# Patient Record
Sex: Female | Born: 1990 | Race: White | Hispanic: Yes | Marital: Single | Smoking: Never smoker
Health system: Southern US, Community
[De-identification: ages and names within clinical notes are randomized; demographics above are authoritative.]

## PROBLEM LIST (undated history)

## (undated) DIAGNOSIS — E282 Polycystic ovarian syndrome: Secondary | ICD-10-CM

## (undated) HISTORY — DX: Polycystic ovarian syndrome: E28.2

## (undated) HISTORY — PX: NO PAST SURGERIES: SHX2092

---

## 2009-09-08 ENCOUNTER — Emergency Department (HOSPITAL_COMMUNITY): Admission: EM | Admit: 2009-09-08 | Discharge: 2009-09-08 | Payer: Self-pay | Admitting: Emergency Medicine

## 2010-09-19 LAB — GC/CHLAMYDIA PROBE AMP, GENITAL: Chlamydia, DNA Probe: NEGATIVE

## 2010-09-19 LAB — COMPREHENSIVE METABOLIC PANEL
AST: 73 U/L — ABNORMAL HIGH (ref 0–37)
Albumin: 4.5 g/dL (ref 3.5–5.2)
BUN: 5 mg/dL — ABNORMAL LOW (ref 6–23)
Calcium: 9.1 mg/dL (ref 8.4–10.5)
Chloride: 106 mEq/L (ref 96–112)
Creatinine, Ser: 0.5 mg/dL (ref 0.4–1.2)
GFR calc Af Amer: >60 mL/min (ref >60)
Potassium: 3.3 mEq/L — ABNORMAL LOW (ref 3.5–5.1)

## 2010-09-19 LAB — WET PREP, GENITAL: Yeast Wet Prep HPF POC: NONE SEEN

## 2010-09-19 LAB — DIFFERENTIAL
Eosinophils Relative: 0 % (ref 0–5)
Lymphocytes Relative: 17 % (ref 12–46)
Monocytes Absolute: 1.2 10*3/uL — ABNORMAL HIGH (ref 0.1–1.0)
Monocytes Relative: 8 % (ref 3–12)
Neutro Abs: 12.5 10*3/uL — ABNORMAL HIGH (ref 1.7–7.7)
Neutrophils Relative %: 75 % (ref 43–77)

## 2010-09-19 LAB — URINALYSIS, ROUTINE W REFLEX MICROSCOPIC
Nitrite: NEGATIVE
Protein, ur: NEGATIVE mg/dL

## 2010-09-19 LAB — CBC
HCT: 42.8 % (ref 36.0–46.0)
MCHC: 34.2 g/dL (ref 30.0–36.0)
Platelets: 205 10*3/uL (ref 150–400)
RDW: 13.2 % (ref 11.5–15.5)

## 2010-09-19 LAB — URINE MICROSCOPIC-ADD ON

## 2010-09-19 LAB — POCT PREGNANCY, URINE: Preg Test, Ur: NEGATIVE

## 2012-02-28 ENCOUNTER — Encounter: Payer: Self-pay | Admitting: Obstetrics & Gynecology

## 2012-03-20 ENCOUNTER — Ambulatory Visit (INDEPENDENT_AMBULATORY_CARE_PROVIDER_SITE_OTHER): Payer: Self-pay | Admitting: Obstetrics & Gynecology

## 2012-03-20 ENCOUNTER — Encounter: Payer: Self-pay | Admitting: Obstetrics & Gynecology

## 2012-03-20 VITALS — BP 138/81 | HR 87 | Temp 97.6°F | Resp 12 | Ht 60.0 in | Wt 167.2 lb

## 2012-03-20 DIAGNOSIS — N949 Unspecified condition associated with female genital organs and menstrual cycle: Secondary | ICD-10-CM

## 2012-03-20 DIAGNOSIS — N938 Other specified abnormal uterine and vaginal bleeding: Secondary | ICD-10-CM

## 2012-03-20 NOTE — Progress Notes (Signed)
  Subjective:    Patient ID: Kim Blair, female    DOB: [birth year], 21 y.o.   MRN: [id number]  HPI  21 yo H G0 who [facility] for [facility] of possible PCOS. She describes monthly periods, but they are never on time. They may come 15-40 days between cycles. She also complains of unwanted hair growth. She was prescribed OCPs at the [facility]pt and told to start after her NMP, but she hasn't had one since that August visit.  Review of Systems     Objective:   Physical Exam        Assessment & Plan:  Possible PCOS- labs, u/s She will return with an interpretor for discussion of results.

## 2012-03-21 LAB — TSH: TSH: 1.256 u[IU]/mL (ref 0.350–4.500)

## 2012-03-21 LAB — TESTOSTERONE, FREE, TOTAL, SHBG: Testosterone-% Free: 1.9 % (ref 0.4–2.4)

## 2012-03-27 ENCOUNTER — Ambulatory Visit (HOSPITAL_COMMUNITY)
Admission: RE | Admit: 2012-03-27 | Discharge: 2012-03-27 | Disposition: A | Discharge status: Home / Self Care | Payer: Self-pay | Source: Ambulatory Visit | Attending: Obstetrics & Gynecology | Admitting: Obstetrics & Gynecology

## 2012-03-27 DIAGNOSIS — N938 Other specified abnormal uterine and vaginal bleeding: Secondary | ICD-10-CM | POA: Insufficient documentation

## 2012-03-27 DIAGNOSIS — N949 Unspecified condition associated with female genital organs and menstrual cycle: Secondary | ICD-10-CM | POA: Insufficient documentation

## 2012-03-27 DIAGNOSIS — Q51810 Arcuate uterus: Secondary | ICD-10-CM | POA: Insufficient documentation

## 2012-04-22 ENCOUNTER — Ambulatory Visit (INDEPENDENT_AMBULATORY_CARE_PROVIDER_SITE_OTHER): Payer: Self-pay | Admitting: Obstetrics & Gynecology

## 2012-04-22 ENCOUNTER — Encounter: Payer: Self-pay | Admitting: Obstetrics & Gynecology

## 2012-04-22 VITALS — BP 116/77 | HR 76 | Temp 97.2°F | Ht 59.0 in | Wt 164.9 lb

## 2012-04-22 DIAGNOSIS — N915 Oligomenorrhea, unspecified: Secondary | ICD-10-CM

## 2012-04-22 NOTE — Progress Notes (Signed)
  Subjective:    Patient ID: Kim Blair, female    DOB: July 08, 1990, 21 y.o.   MRN: [id number]  HPI  21 yo H G0 here for follow up of her labs. Since her last visit she started her period and has started her OCPs. She plans to get her Gardasil series at the health dept.  Review of Systems     Objective:   Physical Exam  Normal labs (test, TSH, prolactin, gyn u/s)      Assessment & Plan:   I have recommended weight loss and continue her OCPs.

## 2014-02-06 LAB — PREGNANCY, URINE: Preg Test, Ur: POSITIVE

## 2014-04-15 ENCOUNTER — Encounter (HOSPITAL_COMMUNITY): Payer: Self-pay | Admitting: *Deleted

## 2014-04-27 ENCOUNTER — Encounter (HOSPITAL_COMMUNITY): Payer: Self-pay | Admitting: *Deleted

## 2014-04-29 ENCOUNTER — Ambulatory Visit (INDEPENDENT_AMBULATORY_CARE_PROVIDER_SITE_OTHER): Payer: Self-pay | Admitting: Advanced Practice Midwife

## 2014-04-29 ENCOUNTER — Encounter: Payer: Self-pay | Admitting: Advanced Practice Midwife

## 2014-04-29 VITALS — BP 110/70 | HR 68 | Temp 98.3°F | Wt 174.3 lb

## 2014-04-29 DIAGNOSIS — Z3492 Encounter for supervision of normal pregnancy, unspecified, second trimester: Secondary | ICD-10-CM

## 2014-04-29 DIAGNOSIS — Z3482 Encounter for supervision of other normal pregnancy, second trimester: Secondary | ICD-10-CM

## 2014-04-29 DIAGNOSIS — Z23 Encounter for immunization: Secondary | ICD-10-CM

## 2014-04-29 LAB — POCT URINALYSIS DIP (DEVICE)
BILIRUBIN URINE: NEGATIVE
Glucose, UA: NEGATIVE mg/dL
Ketones, ur: NEGATIVE mg/dL
Leukocytes, UA: NEGATIVE
Nitrite: NEGATIVE
PH: 7 (ref 5.0–8.0)
Protein, ur: NEGATIVE mg/dL
Specific Gravity, Urine: 1.015 (ref 1.005–1.030)
Urobilinogen, UA: 0.2 mg/dL (ref 0.0–1.0)

## 2014-04-29 MED ORDER — CONCEPT OB 130-92.4-1 MG PO CAPS: 1.0000 | ORAL_CAPSULE | Freq: Every day | ORAL | Status: AC

## 2014-04-29 NOTE — Progress Notes (Signed)
   Subjective:    Kim Blair is a G1P0 [id number] being seen today for her first obstetrical visit.  Her obstetrical history is significant for nothing. Patient does intend to breast feed. Pregnancy history fully reviewed.  Partner has genital herpes. Pt does not.   Patient reports Occassional mild SOB at rest x 1 week and pruritic rash underarms, on lower abd, under breasts and inbtw thighs since beginning of pregnancy. No relief w/ OTC cream (name unknown).  Filed Vitals:   04/29/14 0938  BP: 110/70  Pulse: 68  Temp: 98.3 F (36.8 C)  Weight: 174 lb 4.8 oz (79.062 kg)    HISTORY: OB History  Gravida Para Term Preterm AB SAB TAB Ectopic Multiple Living  1             # Outcome Date GA Lbr Len/2nd Weight Sex Delivery Anes PTL Lv  1 Current              Past Medical History  Diagnosis Date  . PCOS (polycystic ovarian syndrome)    History reviewed. No pertinent past surgical history. History reviewed. No pertinent family history.   Exam    Uterus:  Fundal Height: 22 cm  Pelvic Exam:    Perineum: No Hemorrhoids   Vulva: normal   Vagina:  normal mucosa, normal discharge   pH: NA   Cervix: no bleeding following Pap and nulliparous appearance   Adnexa: no mass, fullness, tenderness   Bony Pelvis: average  System: Breast:  Declined exam.   Skin: dermatitis noted: dry, lacy flat rash on anterio bilt axilla and low abd. No rash viable under breast of inner thighs.    Neurologic: oriented, normal mood, gait normal; reflexes normal and symmetric, grossly non-focal   Extremities: normal strength, tone, and muscle mass, Neg Homan's. No edema   HEENT sclera clear, anicteric and thyroid without masses   Mouth/Teeth mucous membranes moist, pharynx normal without lesions and dental hygiene good   Neck no masses   Cardiovascular: regular rate and rhythm, no murmurs or gallops   Respiratory:  appears well, vitals normal, no respiratory distress, acyanotic, normal RR, chest  clear, no wheezing, crepitations, rhonchi, normal symmetric air entry   Abdomen: soft, non-tender; bowel sounds normal; no masses,  no organomegaly and Gravid, S=D   Urinary: urethral meatus normal      Assessment:    Pregnancy: G1P0 1. Supervision of normal pregnancy in second trimester  - Prenatal Profile - Hemoglobinopathy evaluation - Culture, OB Urine - Prescript Monitor Profile(19) - Cytology - PAP - US OB Comp + 14 Wk; Future - Glucose Tolerance, 1 HR (50g) w/o Fasting - Prenat w/o A Vit-[id number]-FePo-FA (CONCEPT OB) 130-92.4-1 MG CAPS; Take 1 capsule by mouth daily.  Dispense: 30 capsule; Refill: 9  2. Flu vaccine need  - Flu Vaccine QUAD 36+ mos IM; Standing - Flu Vaccine QUAD 36+ mos IM    Plan:     Initial labs drawn. Prenatal vitamins. Problem list reviewed and updated. Genetic Screening discussed Quad Screen: ordered. Ultrasound discussed; fetal survey: ordered. Follow up in 4 weeks. 50% of 30 min visit spent on counseling and coordination of care.  Early 1 hour GTT.    Dorathy KinsmanSMITH, Jarelyn Bambach 04/29/2014

## 2014-04-29 NOTE — Progress Notes (Signed)
US scheduled 11/6 @ 1530

## 2014-04-29 NOTE — Patient Instructions (Addendum)
Evaluacin de los movimientos fetales  (Fetal Movement Counts) Nombre del paciente: __________________________________________________ Kim ChapmanFecha de parto estimada: ____________________ Caroleen HammanLa evaluacin de los movimientos fetales es muy recomendable en los embarazos de alto riesgo, pero tambin es una buena idea que lo hagan todas las Beech Bottomembarazadas. El Firefightermdico le indicar que comience a contarlos a las 28 semanas de Key Centerembarazo. Los movimientos fetales suelen aumentar:   Despus de Animatoruna comida completa.  Despus de la actividad fsica.  Despus de comer o beber Kim Electricalgo dulce o fro.  En reposo. Preste atencin cuando sienta que el beb est ms activo. Esto le ayudar a notar un patrn de ciclos de vigilia y sueo de su beb y cules son los factores que contribuyen a un aumento de los movimientos fetales. Es importante llevar a cabo un recuento de movimientos fetales, al mismo tiempo cada da, cuando el beb normalmente est ms activo.  CMO CONTAR LOS MOVIMIENTOS FETALES  Busque un lugar tranquilo y cmodo para sentarse o recostarse sobre el lado izquierdo. Al recostarse sobre su lado izquierdo, le proporciona una mejor circulacin de Ettricksangre y oxgeno al beb.  Anote el da y la hora en una hoja de papel o en un diario.  Comience contando las pataditas, revoloteos, chasquidos, vueltas o pinchazos en un perodo de 2 horas. Debe sentir al menos 10 movimientos en 2 horas.  Si no siente 10 movimientos en 2 horas, espere 2  3 horas y cuente de nuevo. Busque cambios en el patrn o si no cuenta lo suficiente en 2 horas. SOLICITE ATENCIN MDICA SI:   Siente menos de 10 pataditas en 2 horas, en dos intentos.  No hay movimientos durante una hora.  El patrn se modifica o le lleva ms tiempo Art gallery managercada da contar las 10 pataditas.  Siente que el beb no se mueve como lo hace habitualmente. Fecha: ____________ Movimientos: ____________ Stevan BornHora de inicio: ____________ Stevan BornHora de finalizacin: ____________  Franco NonesFecha:  ____________ Movimientos: ____________ Stevan BornHora de inicio: ____________ Stevan BornHora de finalizacin: ____________  Franco NonesFecha: ____________ Movimientos: ____________ Stevan BornHora de inicio: ____________ Stevan BornHora de finalizacin: ____________  Franco NonesFecha: ____________ Movimientos: ____________ Stevan BornHora de inicio: ____________ Stevan BornHora de finalizacin: ____________  Franco NonesFecha: ____________ Movimientos: ____________ Stevan BornHora de inicio: ____________ Kim RussianHora de finalizacin: ____________  Franco NonesFecha: ____________ Movimientos: ____________ Kim RussianHora de inicio: ____________ Kim RussianHora de finalizacin: ____________  Franco NonesFecha: ____________ Movimientos: ____________ Kim RussianHora de inicio: ____________ Kim RussianHora de finalizacin: ____________  Franco NonesFecha: ____________ Movimientos: ____________ Kim RussianHora de inicio: ____________ Kim RussianHora de finalizacin: ____________  Franco NonesFecha: ____________ Movimientos: ____________ Kim RussianHora de inicio: ____________ Kim RussianHora de finalizacin: ____________  Franco NonesFecha: ____________ Movimientos: ____________ Kim RussianHora de inicio: ____________ Kim RussianHora de finalizacin: ____________  Franco NonesFecha: ____________ Movimientos: ____________ Kim RussianHora de inicio: ____________ Kim RussianHora de finalizacin: ____________  Franco NonesFecha: ____________ Movimientos: ____________ Kim RussianHora de inicio: ____________ Kim RussianHora de finalizacin: ____________  Franco NonesFecha: ____________ Movimientos: ____________ Kim RussianHora de inicio: ____________ Kim RussianHora de finalizacin: ____________  Franco NonesFecha: ____________ Movimientos: ____________ Kim RussianHora de inicio: ____________ Kim RussianHora de finalizacin: ____________  Franco NonesFecha: ____________ Movimientos: ____________ Kim RussianHora de inicio: ____________ Kim RussianHora de finalizacin: ____________  Franco NonesFecha: ____________ Movimientos: ____________ Kim RussianHora de inicio: ____________ Kim RussianHora de finalizacin: ____________  Franco NonesFecha: ____________ Movimientos: ____________ Kim RussianHora de inicio: ____________ Kim RussianHora de finalizacin: ____________  Franco NonesFecha: ____________ Movimientos: ____________ Kim RussianHora de inicio: ____________ Kim RussianHora de finalizacin: ____________  Franco NonesFecha: ____________ Movimientos: ____________ Kim RussianHora  de inicio: ____________ Kim RussianHora de finalizacin: ____________  Franco NonesFecha: ____________ Movimientos: ____________ Kim RussianHora de inicio: ____________ Stevan BornHora de finalizacin: ____________  Franco NonesFecha: ____________ Movimientos: ____________ Stevan BornHora de inicio: ____________ Stevan BornHora de finalizacin: ____________  Franco NonesFecha: ____________ Movimientos: ____________ Stevan BornHora de inicio: ____________ Stevan BornHora de  finalizacin: ____________  [name]: ____________ Movimientos: ____________ [name] inicio: ____________ [name] finalizacin: ____________  [name]: ____________ Movimientos: ____________ [name] inicio: ____________ [name] finalizacin: ____________  [name]: ____________ Movimientos: ____________ [name] inicio: ____________ [name] finalizacin: ____________  [name]: ____________ Movimientos: ____________ [name] inicio: ____________ [name] finalizacin: ____________  [name]: ____________ Movimientos: ____________ [name] inicio: ____________ [name] finalizacin: ____________  [name]: ____________ Movimientos: ____________ [name] inicio: ____________ Kim Blair de finalizacin: ____________  [name]: ____________ Movimientos: ____________ Kim Blair de inicio: ____________ Kim Blair de finalizacin: ____________  [name]: ____________ Movimientos: ____________ Kim Blair de inicio: ____________ Kim Blair de finalizacin: ____________  [name]: ____________ Movimientos: ____________ Kim Blair de inicio: ____________ Kim Blair de finalizacin: ____________  [name]: ____________ Movimientos: ____________ Kim Blair de inicio: ____________ Kim Blair de finalizacin: ____________  [name]: ____________ Movimientos: ____________ Kim Blair de inicio: ____________ Kim Blair de finalizacin: ____________  [name]: ____________ Movimientos: ____________ Kim Blair de inicio: ____________ Kim Blair de finalizacin: ____________  [name]: ____________ Movimientos: ____________ Kim Blair de inicio: ____________ Kim Blair de finalizacin: ____________  [name]: ____________ Movimientos: ____________ Kim Blair de inicio: ____________ Kim Blair de finalizacin:  ____________  [name]: ____________ Movimientos: ____________ Kim Blair de inicio: ____________ Kim Blair de finalizacin: ____________  [name]: ____________ Movimientos: ____________ Kim Blair de inicio: ____________ Kim Blair de finalizacin: ____________  [name]: ____________ Movimientos: ____________ Kim Blair de inicio: ____________ Kim Blair de finalizacin: ____________  [name]: ____________ Movimientos: ____________ Kim Blair de inicio: ____________ Kim Blair de finalizacin: ____________  [name]: ____________ Movimientos: ____________ Kim Blair de inicio: ____________ Kim Blair de finalizacin: ____________  [name]: ____________ Movimientos: ____________ Kim Blair de inicio: ____________ Kim Blair de finalizacin: ____________  [name]: ____________ Movimientos: ____________ Kim Blair de inicio: ____________ Kim Blair de finalizacin: ____________  [name]: ____________ Movimientos: ____________ Kim Blair de inicio: ____________ Kim Blair de finalizacin: ____________  [name]: ____________ Movimientos: ____________ Kim Blair de inicio: ____________ Kim Blair de finalizacin: ____________  [name]: ____________ Movimientos: ____________ Kim Blair de inicio: ____________ Kim Blair de finalizacin: ____________  [name]: ____________ Movimientos: ____________ Kim Blair de inicio: ____________ Kim Blair de finalizacin: ____________  [name]: ____________ Movimientos: ____________ Kim Blair de inicio: ____________ Kim Blair de finalizacin: ____________  [name]: ____________ Movimientos: ____________ Kim Blair de inicio: ____________ Kim Blair de finalizacin: ____________  [name]: ____________ Movimientos: ____________ Kim Blair de inicio: ____________ Kim Blair de finalizacin: ____________  [name]: ____________ Movimientos: ____________ Kim Blair de inicio: ____________ Kim Blair de finalizacin: ____________  [name]: ____________ Movimientos: ____________ Kim Blair de inicio: ____________ Kim Blair de finalizacin: ____________  [name]: ____________ Movimientos: ____________ Kim Blair de inicio: ____________ Kim Blair de finalizacin: ____________  [name]: ____________  Movimientos: ____________ Kim Blair de inicio: ____________ Kim Blair de finalizacin: ____________  [name]: ____________ Movimientos: ____________ Kim Blair de inicio: ____________ Kim Blair de finalizacin: ____________  [name]: ____________ Movimientos: ____________ Kim Blair de inicio: ____________ Kim Blair de finalizacin: ____________  Document Released: 09/19/2007 Document Revised: 05/29/2012 ExitCare Patient Information 2015 [place], [place]. This information is not intended to replace advice given to you by your health care provider. Make sure you discuss any questions you have with your health care provider.    Segundo trimestre de Psychiatrist (Second Trimester of Pregnancy) El segundo trimestre va desde la semana13 hasta la 28, desde el cuarto hasta el sexto mes, y suele ser el momento en el que mejor se siente. Su organismo se ha adaptado a Charity fundraiser y comienza a Diplomatic Services operational officer. En general, las nuseas matutinas han disminuido o han desaparecido completamente, p El segundo trimestre es tambin la poca en la que el feto se desarrolla rpidamente. Hacia el final del sexto mes, el feto mide aproximadamente 9pulgadas (23cm) y pesa alrededor de 1 libras (700g). Es probable que sienta que el beb se Teacher, English as a foreign language (da pataditas) [place]  18 y [place] del Psychiatrist. CAMBIOS EN EL ORGANISMO Su organismo atraviesa por muchos cambios durante el [place], y estos varan de Neomia Dear mujer a Educational psychologist.   Seguir [employer] Companies. Notar que la parte baja del abdomen sobresale.  Podrn aparecer las primeras Albertson's caderas, el abdomen y las Quinnesec.  Es posible que tenga dolores de cabeza que pueden aliviarse con los medicamentos que su mdico autorice.  Tal vez tenga necesidad de orinar con ms frecuencia porque el feto est ejerciendo presin Ambulance person.  Debido al Vanetta Mulders podr sentir Anthoney Harada estomacal con frecuencia.  Puede estar estreida, ya que ciertas hormonas enlentecen los movimientos de los  msculos que [employer] desechos a travs de los intestinos.  Pueden aparecer hemorroides o abultarse e hincharse las venas (venas varicosas).  Puede tener dolor de espalda que se debe al Citigroup de peso y a que las hormonas del Management consultant las articulaciones entre los huesos de la pelvis, y Public librarian consecuencia de la modificacin del peso y los msculos que mantienen el equilibrio.  Las [employer]ra Foods seguirn creciendo y Development worker, community.  Las Veterinary surgeon y estar sensibles al cepillado y al hilo dental.  Pueden aparecer zonas oscuras o manchas (cloasma, mscara del Psychiatrist) en el rostro que probablemente se atenuarn despus del nacimiento del beb.  Es posible que se forme una lnea oscura desde el ombligo hasta la zona del pubis (linea nigra) que probablemente se atenuarn despus del nacimiento del beb.  Tal vez haya cambios en el cabello que pueden incluir su engrosamiento, crecimiento rpido y cambios en la textura. Adems, a algunas mujeres se les cae el cabello durante o despus del embarazo, o tienen el cabello seco o fino. Lo ms probable es que el cabello se le normalice despus del nacimiento del beb. QU DEBE ESPERAR EN LAS CONSULTAS PRENATALES Durante una visita prenatal de rutina:  La pesarn para asegurarse de que usted y el feto estn creciendo normalmente.  Le tomarn la presin arterial.  Le medirn el abdomen para controlar el desarrollo del beb.  Se escucharn los latidos cardacos fetales.  Se evaluarn los resultados de los estudios solicitados en visitas anteriores. El mdico puede preguntarle lo siguiente:  Cmo se siente.  Si siente los movimientos del beb.  Si ha tenido sntomas anormales, como prdida de lquido, [place], dolores de cabeza intensos o clicos abdominales.  Si tiene [employer]. Otros estudios que podrn realizarse durante el segundo trimestre incluyen lo siguiente:  Anlisis de sangre para detectar:  Concentraciones de  hierro bajas (anemia).  Diabetes gestacional (entre la semana 24 y la 28).  Anticuerpos Rh.  Anlisis de orina para detectar infecciones, diabetes o protenas en la orina.  Una ecografa para confirmar que el beb crece y se desarrolla correctamente.  Una amniocentesis para diagnosticar posibles problemas genticos.  Estudios del feto para descartar espina bfida y sndrome de [place]. INSTRUCCIONES PARA EL CUIDADO EN EL HOGAR   Evite fumar, consumir hierbas, beber alcohol y tomar frmacos que no le hayan recetado. Estas sustancias qumicas afectan la formacin y el desarrollo del beb.  Siga las indicaciones del mdico en relacin con el uso de medicamentos. Durante el embarazo, hay medicamentos que son seguros de tomar y otros que no.  Haga actividad fsica solo en la forma indicada por el mdico. Sentir clicos uterinos es un buen signo para Restaurant manager, fast food actividad fsica.  Contine comiendo alimentos que sanos con regularidad.  Use un sostn que le brinde buen soporte si  le duelen las [place].  No se d baos de inmersin en agua caliente, baos turcos ni saunas.  Colquese el cinturn de seguridad cuando conduzca.  No coma carne cruda ni queso sin cocinar; evite el contacto con las bandejas sanitarias de los gatos y la tierra que estos animales usan. Estos elementos contienen grmenes que pueden causar defectos congnitos en el beb.  Tome las vitaminas prenatales.  Si est estreida, pruebe un laxante suave (si el mdico lo autoriza). Consuma ms alimentos ricos en fibra, como vegetales y frutas frescos y Radiation protection practitioner. Beba gran cantidad de lquido para mantener la orina de tono claro o color amarillo plido.  Dese baos de asiento con agua tibia para Engineer, materials o las molestias causadas por las hemorroides. Use una crema para las hemorroides si el mdico la autoriza.  Si tiene venas varicosas, use medias de descanso. Eleve los pies durante , 3 o 4veces por  da. Limite la cantidad de sal en su dieta.  No levante objetos pesados, use zapatos de tacones bajos y [address].  Descanse con las piernas elevadas si tiene calambres o dolor de cintura.  Visite a su dentista si an no lo ha Occupational hygienist. Use un cepillo de dientes blando para higienizarse los dientes y psese el hilo dental con suavidad.  Puede seguir [employer], a menos que el mdico le indique lo contrario.  Concurra a todas las visitas prenatales segn las indicaciones de su mdico. SOLICITE ATENCIN MDICA SI:   [place].  Siente clicos leves, presin en la pelvis o dolor persistente en el abdomen.  Tiene nuseas, vmitos o diarrea persistentes.  Tiene secrecin vaginal con mal olor.  Siente dolor al [employer]oco[employer]ill[employer]. SOLICITE ATENCIN MDICA DE INMEDIATO SI:   Tiene fiebre.  Tiene una prdida de lquido por la vagina.  Tiene sangrado o pequeas prdidas vaginales.  Siente dolor intenso o clicos en el abdomen.  Sube o baja de peso rpidamente.  Tiene dificultad para respirar y siente dolor de pecho.  Sbitamente se le hinchan mucho el rostro, las [place], los tobillos, los pies o las piernas.  No ha sentido los movimientos del beb durante Georgianne Fick.  Siente un dolor de cabeza intenso que no se alivia con medicamentos.  Hay cambios en la visin. Document Released: 03/22/2005 Document Revised: 06/17/2013 [facility] Patient Information 2015 [place], [place]. This information is not intended to replace advice given to you by your health care provider. Make sure you discuss any questions you have with your health care provider.  Lactancia materna (Breastfeeding) Decidir Museum/gallery exhibitions officer es una de las mejores elecciones que puede hacer por usted y su beb. El cambio hormonal durante el Psychiatrist produce el desarrollo del tejido mamario y [place] la cantidad y el tamao de los conductos galactforos. Estas hormonas tambin permiten que  las protenas, los azcares y las grasas de la sangre produzcan la [employer] materna en las glndulas productoras de [place]. Las hormonas impiden que la leche materna sea liberada antes del nacimiento del beb, adems de impulsar el flujo de leche luego del nacimiento. Una vez que ha comenzado a Museum/gallery exhibitions officer, Conservation officer, nature beb, as Immunologist succin o Theatre manager, pueden estimular la liberacin de [place] de las glndulas productoras de [place].  LOS BENEFICIOS DE AMAMANTAR Para el beb  La primera leche (calostro) ayuda a Careers information officer funcionamiento del sistema digestivo del beb.  La leche tiene anticuerpos que ayudan a Radio producer las infecciones en el beb.  El  beb tiene Research scientist (medical) incidencia de asma, alergias y del sndrome de muerte sbita del lactante.  Los nutrientes en la [place] materna son mejores para el beb que la Miguel Barrera maternizada y estn preparados exclusivamente para cubrir las necesidades del beb.  La leche materna mejora el desarrollo cerebral del beb.  Es menos probable que el beb desarrolle otras enfermedades, como obesidad infantil, asma o diabetes mellitus de tipo 2. Para usted   La lactancia materna favorece el desarrollo de un vnculo muy especial entre la madre y el beb.  Es conveniente. La leche materna siempre est disponible a la Human resources officer y es [place].  La lactancia materna ayuda a quemar caloras y a perder el peso ganado durante el [place].  Favorece la contraccin del tero al tamao que tena antes del embarazo de manera ms rpida y disminuye el sangrado (loquios) despus del parto.  La lactancia materna contribuye a reducir Nurse, adult de desarrollar diabetes mellitus de tipo 2, osteoporosis o cncer de mama o de ovario en el futuro. SIGNOS DE QUE EL BEB EST HAMBRIENTO Primeros signos de [address] de [place].  Se estira.  Mueve la cabeza de un lado a otro.  Mueve la cabeza y abre la boca cuando se le toca la mejilla o la  comisura de la boca (reflejo de bsqueda).  Aumenta las vocalizaciones, tales como sonidos de succin, se relame los labios, emite arrullos, suspiros, o chirridos.  Mueve la [employer] boca.  Se chupa con ganas los dedos o las manos. Signos tardos de Fisher [employer].  Llora de manera intermitente. Signos de [employer] signos de hambre extrema requerirn que lo calme y lo consuele antes de que el beb pueda alimentarse adecuadamente. No espere a que se manifiesten los siguientes signos de hambre extrema para comenzar a Museum/gallery exhibitions officer:   Designer, jewellery.  Llanto intenso y fuerte.   Gritos. INFORMACIN BSICA SOBRE LA LACTANCIA MATERNA Iniciacin de la lactancia materna  Encuentre un lugar cmodo para sentarse o acostarse, con un buen respaldo para el cuello y la espalda.  Coloque una almohada o una manta enrollada debajo del beb para acomodarlo a la altura de la mama (si est sentada). Las almohadas para Museum/gallery exhibitions officer se han diseado especialmente a fin de servir de apoyo para los brazos y el beb [employer].  Asegrese de que el abdomen del beb est frente al suyo.  Masajee suavemente la mama. Con las yemas de los dedos, masajee la pared del pecho hacia el pezn en un movimiento circular. Esto estimula el flujo de [place]. Es posible que Engineer, manufacturing systems este movimiento mientras amamanta si la leche fluye lentamente.  Sostenga la mama con el pulgar por arriba del pezn y los otros 4 dedos por debajo de la mama. Asegrese de que los dedos se encuentren lejos del pezn y de la boca del beb.  Empuje suavemente los labios del beb con el pezn o con el dedo.  Cuando la boca del beb se abra lo suficiente, acrquelo rpidamente a la mama e introduzca todo el pezn y la zona oscura que lo rodea (areola), tanto como sea posible, dentro de la boca del beb.  Debe haber ms areola visible por arriba del labio superior del beb que por debajo del labio inferior.  La lengua del  beb debe estar entre la enca inferior y la [place].  Asegrese de que la boca del beb est en la posicin correcta alrededor del pezn (prendida).  Los labios del beb deben crear un sello sobre la mama y estar doblados hacia afuera (invertidos).  Es comn que el beb succione durante 2 a 3 minutos para que comience el flujo de [place]. Cmo debe prenderse Es muy importante que le ensee al beb cmo prenderse adecuadamente a la mama. Si el beb no se prende adecuadamente, puede causarle dolor en el pezn y reducir la produccin de [place], y hacer que el beb tenga un escaso aumento de [place]. Adems, si el beb no se prende adecuadamente al pezn, puede tragar aire durante la alimentacin. Esto puede causarle molestias al beb. Hacer eructar al beb al Pilar Plate de mama puede ayudarlo a liberar el aire. Sin embargo, ensearle al beb cmo prenderse a la mama adecuadamente es la mejor manera de evitar que se sienta molesto por tragar Oceanographer se alimenta. Signos de que el beb se ha prendido adecuadamente al pezn:   Payton Doughty o succiona de modo silencioso, sin causarle dolor.  Se escucha que traga cada 3 o 4 succiones.   Hay movimientos musculares por arriba y por delante de sus odos al Printmaker. Signos de que el beb no se ha prendido Audiological scientist al pezn:   Hace ruidos de succin o de chasquido mientras se alimenta.  Siente dolor en el pezn. Si cree que el beb no se prendi correctamente, deslice el dedo en la comisura de la boca y [employer] las encas del beb para interrumpir la succin. Intente comenzar a amamantar nuevamente. Signos de Fish farm manager Signos del beb:   Disminuye gradualmente el nmero de succiones o cesa la succin por completo.  Se duerme.  Relaja el cuerpo.  Retiene una pequea cantidad de [facility] boca.  Se desprende solo del pecho. Signos que presenta usted:  Las mamas han aumentado la firmeza, el peso y el tamao 1 a  3 horas despus de Museum/gallery exhibitions officer.  Estn ms blandas inmediatamente despus de amamantar.  Un aumento del volumen de [place], y tambin un cambio en su consistencia y color se producen hacia el quinto da de Tour manager.  Los pezones no duelen, ni estn agrietados ni sangran. Signos de que su beb recibe la cantidad de leche suficiente  Moja al menos 3 paales en 24 horas. La orina debe ser clara y de color amarillo plido a los [address] de [place].  Defeca al menos 3 veces en 24 horas a los 5 [address] de [address] La materia fecal debe ser blanda y [place].  Defeca al menos 3 veces en 24 horas a los [address] de [address] La materia fecal debe ser grumosa y [place].  No registra una prdida de peso mayor del 10% del peso al nacer durante los primeros 3 [address] de [place].  Aumenta de peso un promedio de 4 a 7onzas (113 a 198g) por semana despus de los [address] de vida.  Aumenta de [place], [place], de [place] uniforme a Glass blower/designer de los [address] de vida, sin Passenger transport manager prdida de peso despus de las 2semanas de vida. Despus de alimentarse, es posible que el beb regurgite una pequea cantidad. Esto es frecuente. FRECUENCIA Y DURACIN DE LA LACTANCIA MATERNA El amamantamiento frecuente la ayudar a producir ms [place] y a Education officer, community de Engineer, mining en los pezones e hinchazn en las [place]. Alimente al beb cuando muestre signos de hambre o si siente la necesidad de reducir la congestin de las [place]. Esto se denomina "lactancia a demanda". Evite el uso del chupete [place]  trabaja para establecer la lactancia (las primeras 4 a 6 semanas despus del nacimiento del beb). Despus de este perodo, podr ofrecerle un chupete. Las investigaciones demostraron que el uso del chupete durante el primer ao de vida del beb disminuye el riesgo de desarrollar el sndrome de muerte sbita del lactante (SMSL). Permita que el nio se alimente en cada mama todo lo que desee. Contine amamantando al beb hasta que haya terminado  de alimentarse. Cuando el beb se desprende o se queda dormido mientras se est alimentando de la primera mama, ofrzcale la segunda. Debido a que, con frecuencia, los recin [place] las primeras semanas de vida, es posible que deba despertar al beb para alimentarlo. Los horarios de Acupuncturist de un beb a otro. Sin embargo, las siguientes reglas pueden servir como gua para ayudarla a Lawyer que el beb se alimenta adecuadamente:  Se puede amamantar a los recin nacidos (bebs de 4 semanas o menos de vida) cada 1 a 3 horas.  No deben transcurrir ms de 3 horas durante el da o 5 horas durante la noche sin que se amamante a los recin nacidos.  Debe amamantar al beb 8 veces como mnimo en un perodo de 24 horas, hasta que comience a introducir slidos en su dieta, a los 6 meses de vida aproximadamente. EXTRACCIN DE [employer] MATERNA La extraccin y Contractor de la leche materna le permiten asegurarse de que el beb se alimente exclusivamente de [place], aun en momentos en los que no puede amamantar. Esto tiene especial importancia si debe regresar al Aleen Campi en el perodo en que an est amamantando o si no puede estar presente en los momentos en que el beb debe alimentarse. Su asesor en lactancia puede orientarla sobre cunto tiempo es seguro almacenar [place].  El sacaleche es un aparato que le permite extraer leche de la mama a un recipiente estril. Luego, la leche materna extrada puede almacenarse en un refrigerador o Blair engineer. Algunos sacaleches son Birdie Riddle, Delaney Meigs otros son elctricos. Consulte a su asesor en lactancia qu tipo ser ms conveniente para usted. Los sacaleches se pueden comprar; sin embargo, algunos hospitales y grupos de apoyo a la lactancia materna alquilan Sports coach. Un asesor en lactancia puede ensearle cmo extraer [employer], en caso de que prefiera no usar un sacaleche.  CMO CUIDAR  LAS MAMAS DURANTE LA LACTANCIA MATERNA Los pezones se secan, agrietan y duelen durante la Tour manager. Las siguientes recomendaciones pueden ayudarla a Pharmacologist las [employer] y sanas:  Careers information officer usar jabn en los pezones.  Use un sostn de soporte. Aunque no son esenciales, las camisetas sin mangas o los sostenes especiales para Museum/gallery exhibitions officer estn diseados para acceder fcilmente a las mamas, para Museum/gallery exhibitions officer sin tener que quitarse todo el sostn o la camiseta. Evite usar sostenes con aro o sostenes muy ajustados.  Seque al aire sus pezones durante 3 a despus de amamantar al beb.  Utilice solo apsitos de Haematologist sostn para Environmental health practitioner las prdidas de [place]. La prdida de un poco de Public [employer] Group tomas es normal.  Utilice lanolina sobre los pezones luego de Museum/gallery exhibitions officer. La lanolina ayuda a mantener la humedad normal de la piel. Si [place] lanolina pura, no tiene que lavarse los pezones antes de volver a Corporate treasurer al beb. La lanolina pura no es txica para el beb. Adems, puede extraer [employer] algunas gotas de [place] materna y Engineer, maintenance (IT) suavemente esa [employer], para que la [place] se  seque al [place]. Durante las primeras semanas despus de dar a luz, algunas mujeres pueden experimentar hinchazn en las mamas (congestin [place]). La congestin puede hacer que sienta las mamas pesadas, calientes y sensibles al tacto. El pico de la congestin ocurre dentro de los 3 a 5 das despus del [place]. Las siguientes recomendaciones pueden ayudarla a Paramedic la congestin:  Vace por completo las mamas al [employer] o Environmental health practitioner. Puede aplicar calor hmedo en las mamas (en la ducha o con toallas hmedas para manos) antes de Museum/gallery exhibitions officer o extraer [employer]. Esto aumenta la circulacin y [place] a que la [place]. Si el beb no vaca por completo las [address] cuando lo [address], extraiga la [place] restante despus de que haya finalizado.  Use un sostn ajustado (para amamantar o  comn) o una camiseta sin mangas durante 1 o 2 das para indicar al cuerpo que disminuya ligeramente la produccin de [place].  Aplique compresas de hielo [employer], a menos que le resulte demasiado incmodo.  Asegrese de que el beb est prendido y se encuentre en la posicin correcta mientras lo alimenta. Si la congestin persiste luego de 48 horas o despus de seguir estas recomendaciones, comunquese con su mdico o un Holiday representative. RECOMENDACIONES GENERALES PARA EL CUIDADO DE LA SALUD DURANTE LA LACTANCIA MATERNA  Consuma alimentos saludables. Alterne comidas y colaciones, y coma 3 de cada una por da. Dado que lo que come [employer], es posible que algunas comidas hagan que su beb se vuelva ms irritable de lo habitual. Evite comer este tipo de alimentos si percibe que afectan de manera negativa al beb.  Beba leche, jugos de fruta y agua para Patent examiner su sed (aproximadamente 10 vasos al Futures trader).  Descanse con frecuencia, reljese y tome sus vitaminas prenatales para evitar la fatiga, el estrs y la anemia.  Contine con los autocontroles de la mama.  Evite masticar y fumar tabaco.  Evite el consumo de alcohol y drogas. Algunos medicamentos, que pueden ser perjudiciales para el beb, pueden pasar a travs de la [employer]. Es importante que consulte a su mdico antes de Medical sales representative, incluidos todos los medicamentos recetados y de [place], as como los suplementos vitamnicos y herbales. Puede quedar embarazada durante la lactancia. Si desea controlar la natalidad, consulte a su mdico cules son las opciones ms seguras para el beb. SOLICITE ATENCIN MDICA SI:   Usted siente que quiere dejar de Museum/gallery exhibitions officer o se siente frustrada con la lactancia.  Siente dolor en las mamas o en los pezones.  Sus pezones estn agrietados o Water quality scientist.  Sus pechos estn irritados, sensibles o calientes.  Tiene un rea hinchada en cualquiera de las  mamas.  Siente escalofros o fiebre.  Tiene nuseas o vmitos.  Presenta una secrecin de otro lquido distinto de la leche materna de los pezones.  Sus mamas no se llenan antes de Museum/gallery exhibitions officer al beb para el quinto da despus del [place].  Se siente triste y deprimida.  El beb est demasiado somnoliento como para comer bien.  El beb tiene problemas para dormir.  Moja menos de 3 paales en 24 horas.  Defeca menos de 3 veces en 24 horas.  La piel del beb o la parte blanca de los ojos se vuelven amarillentas.  El beb no ha aumentado de [place] a los [address] de [place]. SOLICITE ATENCIN MDICA DE INMEDIATO SI:   El beb est muy cansado Retail buyer) y no se quiere despertar para comer.  Conley Rolls  sube la fiebre sin causa. Document Released: 06/12/2005 Document Revised: 06/17/2013 [facility] Patient Information 2015 [place]ExitCare, MarylandLLC. This information is not intended to replace advice given to you by your health care provider. Make sure you discuss any questions you have with your health care provider. Segundo trimestre de Psychiatristembarazo (Second Trimester of Pregnancy) El segundo trimestre va desde la semana13 hasta la 28, desde el cuarto hasta el sexto mes, y suele ser el momento en el que mejor se siente. Su organismo se ha adaptado a Charity fundraiserestar embarazada y comienza a Diplomatic Services operational officersentirse fsicamente mejor. En general, las nuseas matutinas han disminuido o han desaparecido completamente, p El segundo trimestre es tambin la poca en la que el feto se desarrolla rpidamente. Hacia el final del sexto mes, el feto mide aproximadamente 9pulgadas (23cm) y pesa alrededor de 1 libras (700g). Es probable que sienta que el beb se Teacher, English as a foreign languagemueve (da pataditas) entre las 18 y 20semanas del Psychiatristembarazo. CAMBIOS EN EL ORGANISMO Su organismo atraviesa por muchos cambios durante el [place] Dunseithembarazo, y estos varan de Neomia Dearuna mujer a Educational psychologistotra.   Seguir American Standard Companiesaumentando de peso. Notar que la parte baja del abdomen sobresale.  Podrn aparecer las primeras  Al[place]on'sestras en las caderas, el abdomen y las [place] Headmamas.  Es posible que tenga dolores de cabeza que pueden aliviarse con los medicamentos que su mdico autorice.  Tal vez tenga necesidad de orinar con ms frecuencia porque el feto est ejerciendo presin Ambulance personsobre la vejiga.  Debido al Vanetta Muldersembarazo podr sentir Anthoney Haradaacidez estomacal con frecuencia.  Puede estar estreida, ya que ciertas hormonas enlentecen los movimientos de los msculos que New York Life Insuranceempujan los desechos a travs de los intestinos.  Pueden aparecer hemorroides o abultarse e hincharse las venas (venas varicosas).  Puede tener dolor de espalda que se debe al Citigroupaumento de peso y a que las hormonas del Management consultantembarazo relajan las articulaciones entre los huesos de la pelvis, y Public librariancomo consecuencia de la modificacin del peso y los msculos que mantienen el equilibrio.  Las ConAgra Foodsmamas seguirn creciendo y Development worker, communityle dolern.  Las Veterinary surgeonencas pueden sangrar y estar sensibles al cepillado y al hilo dental.  Pueden aparecer zonas oscuras o manchas (cloasma, mscara del Psychiatristembarazo) en el rostro que probablemente se atenuarn despus del nacimiento del beb.  Es posible que se forme una lnea oscura desde el ombligo hasta la zona del pubis (linea nigra) que probablemente se atenuarn despus del nacimiento del beb.  Tal vez haya cambios en el cabello que pueden incluir su engrosamiento, crecimiento rpido y cambios en la textura. Adems, a algunas mujeres se les cae el cabello durante o despus del embarazo, o tienen el cabello seco o fino. Lo ms probable es que el cabello se le normalice despus del nacimiento del beb. QU DEBE ESPERAR EN LAS CONSULTAS PRENATALES Durante una visita prenatal de rutina:  La pesarn para asegurarse de que usted y el feto estn creciendo normalmente.  Le tomarn la presin arterial.  Le medirn el abdomen para controlar el desarrollo del beb.  Se escucharn los latidos cardacos fetales.  Se evaluarn los resultados de los estudios solicitados en  visitas anteriores. El mdico puede preguntarle lo siguiente:  Cmo se siente.  Si siente los movimientos del beb.  Si ha tenido sntomas anormales, como prdida de lquido, Crockersangrado, dolores de cabeza intensos o clicos abdominales.  Si tiene Colgate-Palmolivealguna pregunta. Otros estudios que podrn realizarse durante el segundo trimestre incluyen lo siguiente:  Anlisis de sangre para detectar:  Concentraciones de hierro bajas (anemia).  Diabetes gestacional (entre la semana 24  y la 28).  Anticuerpos Rh.  Anlisis de orina para detectar infecciones, diabetes o protenas en la orina.  Una ecografa para confirmar que el beb crece y se desarrolla correctamente.  Una amniocentesis para diagnosticar posibles problemas genticos.  Estudios del feto para descartar espina bfida y sndrome de [place]. INSTRUCCIONES PARA EL CUIDADO EN EL HOGAR   Evite fumar, consumir hierbas, beber alcohol y tomar frmacos que no le hayan recetado. Estas sustancias qumicas afectan la formacin y el desarrollo del beb.  Siga las indicaciones del mdico en relacin con el uso de medicamentos. Durante el embarazo, hay medicamentos que son seguros de tomar y otros que no.  Haga actividad fsica solo en la forma indicada por el mdico. Sentir clicos uterinos es un buen signo para Restaurant manager, fast food actividad fsica.  Contine comiendo alimentos que sanos con regularidad.  Use un sostn que le brinde buen soporte si le [employer].  No se d baos de inmersin en agua caliente, baos turcos ni saunas.  Colquese el cinturn de seguridad cuando conduzca.  No coma carne cruda ni queso sin cocinar; evite el contacto con las bandejas sanitarias de los gatos y la tierra que estos animales usan. Estos elementos contienen grmenes que pueden causar defectos congnitos en el beb.  Tome las vitaminas prenatales.  Si est estreida, pruebe un laxante suave (si el mdico lo autoriza). Consuma ms alimentos ricos en fibra,  como vegetales y frutas frescos y Radiation protection practitioner. Beba gran cantidad de lquido para mantener la orina de tono claro o color amarillo plido.  Dese baos de asiento con agua tibia para Engineer, materials o las molestias causadas por las hemorroides. Use una crema para las hemorroides si el mdico la autoriza.  Si tiene venas varicosas, use medias de descanso. Eleve los pies durante , 3 o 4veces por da. Limite la cantidad de sal en su dieta.  No levante objetos pesados, use zapatos de tacones bajos y [address].  Descanse con las piernas elevadas si tiene calambres o dolor de cintura.  Visite a su dentista si an no lo ha Occupational hygienist. Use un cepillo de dientes blando para higienizarse los dientes y psese el hilo dental con suavidad.  Puede seguir [employer], a menos que el mdico le indique lo contrario.  Concurra a todas las visitas prenatales segn las indicaciones de su mdico. SOLICITE ATENCIN MDICA SI:   [place].  Siente clicos leves, presin en la pelvis o dolor persistente en el abdomen.  Tiene nuseas, vmitos o diarrea persistentes.  Tiene secrecin vaginal con mal olor.  Siente dolor al [employer]ocoPhillips. SOLICITE ATENCIN MDICA DE INMEDIATO SI:   Tiene fiebre.  Tiene una prdida de lquido por la vagina.  Tiene sangrado o pequeas prdidas vaginales.  Siente dolor intenso o clicos en el abdomen.  Sube o baja de peso rpidamente.  Tiene dificultad para respirar y siente dolor de pecho.  Sbitamente se le hinchan mucho el rostro, las [place], los tobillos, los pies o las piernas.  No ha sentido los movimientos del beb durante Georgianne Fick.  Siente un dolor de cabeza intenso que no se alivia con medicamentos.  Hay cambios en la visin. Document Released: 03/22/2005 Document Revised: 06/17/2013 [facility] Patient Information 2015 [place], [place]. This information is not intended to replace advice given to  you by your health care provider. Make sure you discuss any questions you have with your health care provider.

## 2014-04-29 NOTE — Progress Notes (Signed)
Initial lab work today and early 1hr gtt.  Alviris Almonte used as interpreter for this encounter.  Reports she has felt intermittently SOB since Saturday.  C/o of a rash that develops under breasts and on legs and arms at night.  Flu vaccine today

## 2014-04-30 LAB — PRENATAL PROFILE (SOLSTAS)
ANTIBODY SCREEN: NEGATIVE
Basophils Absolute: 0 10*3/uL (ref 0.0–0.1)
Basophils Relative: 0 % (ref 0–1)
EOS ABS: 0.2 10*3/uL (ref 0.0–0.7)
EOS PCT: 2 % (ref 0–5)
HCT: 34.7 % — ABNORMAL LOW (ref 36.0–46.0)
HEP B S AG: NEGATIVE
HIV: NONREACTIVE
Hemoglobin: 11.9 g/dL — ABNORMAL LOW (ref 12.0–15.0)
Lymphocytes Relative: 30 % (ref 12–46)
Lymphs Abs: 2.3 10*3/uL (ref 0.7–4.0)
MCH: 30.4 pg (ref 26.0–34.0)
MCHC: 34.3 g/dL (ref 30.0–36.0)
MCV: 88.5 fL (ref 78.0–100.0)
Monocytes Absolute: 0.8 10*3/uL (ref 0.1–1.0)
Monocytes Relative: 10 % (ref 3–12)
Neutro Abs: 4.4 10*3/uL (ref 1.7–7.7)
Neutrophils Relative %: 58 % (ref 43–77)
PLATELETS: 244 10*3/uL (ref 150–400)
RBC: 3.92 MIL/uL (ref 3.87–5.11)
RDW: 13.8 % (ref 11.5–15.5)
RH TYPE: POSITIVE
RUBELLA: 2.42 {index} — AB (ref <0.90)
WBC: 7.6 10*3/uL (ref 4.0–10.5)

## 2014-04-30 LAB — CYTOLOGY - PAP

## 2014-04-30 LAB — GLUCOSE TOLERANCE, 1 HOUR (50G) W/O FASTING: GLUCOSE 1 HOUR GTT: 84 mg/dL (ref 70–140)

## 2014-05-01 ENCOUNTER — Ambulatory Visit (HOSPITAL_COMMUNITY)
Admission: RE | Admit: 2014-05-01 | Discharge: 2014-05-01 | Disposition: A | Discharge status: Home / Self Care | Payer: Self-pay | Source: Ambulatory Visit | Attending: Advanced Practice Midwife | Admitting: Advanced Practice Midwife

## 2014-05-01 DIAGNOSIS — Z36 Encounter for antenatal screening of mother: Secondary | ICD-10-CM | POA: Insufficient documentation

## 2014-05-01 DIAGNOSIS — Z3492 Encounter for supervision of normal pregnancy, unspecified, second trimester: Secondary | ICD-10-CM

## 2014-05-01 DIAGNOSIS — Z3A20 20 weeks gestation of pregnancy: Secondary | ICD-10-CM | POA: Insufficient documentation

## 2014-05-01 LAB — CULTURE, OB URINE
Colony Count: NO GROWTH
Organism ID, Bacteria: NO GROWTH

## 2014-05-01 LAB — HEMOGLOBINOPATHY EVALUATION
HEMOGLOBIN OTHER: 0 %
HGB A2 QUANT: 2.9 % (ref 2.2–3.2)
HGB A: 97.1 % (ref 96.8–97.8)
HGB F QUANT: 0 % (ref 0.0–2.0)
HGB S QUANTITAION: 0 %

## 2014-05-01 LAB — PRESCRIPTION MONITORING PROFILE (19 PANEL)
Amphetamine/Meth: NEGATIVE ng/mL
Barbiturate Screen, Urine: NEGATIVE ng/mL
Benzodiazepine Screen, Urine: NEGATIVE ng/mL
Buprenorphine, Urine: NEGATIVE ng/mL
CANNABINOID SCRN UR: NEGATIVE ng/mL
CARISOPRODOL, URINE: NEGATIVE ng/mL
Cocaine Metabolites: NEGATIVE ng/mL
Creatinine, Urine: 69.06 mg/dL (ref >20.0)
FENTANYL URINE: NEGATIVE ng/mL
MDMA URINE: NEGATIVE ng/mL
Meperidine, Ur: NEGATIVE ng/mL
Methadone Screen, Urine: NEGATIVE ng/mL
Methaqualone: NEGATIVE ng/mL
Nitrites, Initial: NEGATIVE ug/mL
OXYCODONE SCRN UR: NEGATIVE ng/mL
Opiate Screen, Urine: NEGATIVE ng/mL
PHENCYCLIDINE, UR: NEGATIVE ng/mL
PROPOXYPHENE: NEGATIVE ng/mL
TAPENTADOLUR: NEGATIVE ng/mL
TRAMADOL UR: NEGATIVE ng/mL
ZOLPIDEM, URINE: NEGATIVE ng/mL
pH, Initial: 6.8 pH (ref 4.5–8.9)

## 2014-05-02 DIAGNOSIS — Z3A20 20 weeks gestation of pregnancy: Secondary | ICD-10-CM | POA: Insufficient documentation

## 2014-05-02 DIAGNOSIS — Z1389 Encounter for screening for other disorder: Secondary | ICD-10-CM | POA: Insufficient documentation

## 2014-05-27 ENCOUNTER — Ambulatory Visit (INDEPENDENT_AMBULATORY_CARE_PROVIDER_SITE_OTHER): Payer: Self-pay | Admitting: Physician Assistant

## 2014-05-27 VITALS — BP 139/87 | HR 82 | Temp 98.0°F | Wt 176.4 lb

## 2014-05-27 DIAGNOSIS — O219 Vomiting of pregnancy, unspecified: Secondary | ICD-10-CM

## 2014-05-27 DIAGNOSIS — Z3482 Encounter for supervision of other normal pregnancy, second trimester: Secondary | ICD-10-CM

## 2014-05-27 DIAGNOSIS — Z3492 Encounter for supervision of normal pregnancy, unspecified, second trimester: Secondary | ICD-10-CM

## 2014-05-27 LAB — POCT URINALYSIS DIP (DEVICE)
Bilirubin Urine: NEGATIVE
Glucose, UA: NEGATIVE mg/dL
Ketones, ur: NEGATIVE mg/dL
NITRITE: NEGATIVE
PROTEIN: NEGATIVE mg/dL
Specific Gravity, Urine: 1.02 (ref 1.005–1.030)
UROBILINOGEN UA: 0.2 mg/dL (ref 0.0–1.0)
pH: 7 (ref 5.0–8.0)

## 2014-05-27 MED ORDER — METOCLOPRAMIDE HCL 10 MG PO TABS: 10.0000 mg | ORAL_TABLET | Freq: Three times a day (TID) | ORAL | Status: DC | PRN

## 2014-05-27 NOTE — Progress Notes (Signed)
25 weeks, complaining of vomiting 2 days earlier this week, now improved.  No sick contacts.  She is feeling good fetal movement. Denies LOF, vaginal bleeding, dysuria.  Drinking well. PNV qd Reglan PRN nausea RTC 3 weeks

## 2014-05-27 NOTE — Patient Instructions (Signed)
Segundo trimestre de embarazo (Second Trimester of Pregnancy) El segundo trimestre va desde la semana13 hasta la 28, desde el cuarto hasta el sexto mes, y suele ser el momento en el que mejor se siente. Su organismo se ha adaptado a estar embarazada y comienza a sentirse fsicamente mejor. En general, las nuseas matutinas han disminuido o han desaparecido completamente, p El segundo trimestre es tambin la poca en la que el feto se desarrolla rpidamente. Hacia el final del sexto mes, el feto mide aproximadamente 9pulgadas (23cm) y pesa alrededor de 1 libras (700g). Es probable que sienta que el beb se mueve (da pataditas) entre las 18 y 20semanas del embarazo. CAMBIOS EN EL ORGANISMO Su organismo atraviesa por muchos cambios durante el embarazo, y estos varan de una mujer a otra.   Seguir aumentando de peso. Notar que la parte baja del abdomen sobresale.  Podrn aparecer las primeras estras en las caderas, el abdomen y las mamas.  Es posible que tenga dolores de cabeza que pueden aliviarse con los medicamentos que su mdico autorice.  Tal vez tenga necesidad de orinar con ms frecuencia porque el feto est ejerciendo presin sobre la vejiga.  Debido al embarazo podr sentir acidez estomacal con frecuencia.  Puede estar estreida, ya que ciertas hormonas enlentecen los movimientos de los msculos que empujan los desechos a travs de los intestinos.  Pueden aparecer hemorroides o abultarse e hincharse las venas (venas varicosas).  Puede tener dolor de espalda que se debe al aumento de peso y a que las hormonas del embarazo relajan las articulaciones entre los huesos de la pelvis, y como consecuencia de la modificacin del peso y los msculos que mantienen el equilibrio.  Las mamas seguirn creciendo y le dolern.  Las encas pueden sangrar y estar sensibles al cepillado y al hilo dental.  Pueden aparecer zonas oscuras o manchas (cloasma, mscara del embarazo) en el rostro que  probablemente se atenuarn despus del nacimiento del beb.  Es posible que se forme una lnea oscura desde el ombligo hasta la zona del pubis (linea nigra) que probablemente se atenuarn despus del nacimiento del beb.  Tal vez haya cambios en el cabello que pueden incluir su engrosamiento, crecimiento rpido y cambios en la textura. Adems, a algunas mujeres se les cae el cabello durante o despus del embarazo, o tienen el cabello seco o fino. Lo ms probable es que el cabello se le normalice despus del nacimiento del beb. QU DEBE ESPERAR EN LAS CONSULTAS PRENATALES Durante una visita prenatal de rutina:  La pesarn para asegurarse de que usted y el feto estn creciendo normalmente.  Le tomarn la presin arterial.  Le medirn el abdomen para controlar el desarrollo del beb.  Se escucharn los latidos cardacos fetales.  Se evaluarn los resultados de los estudios solicitados en visitas anteriores. El mdico puede preguntarle lo siguiente:  Cmo se siente.  Si siente los movimientos del beb.  Si ha tenido sntomas anormales, como prdida de lquido, sangrado, dolores de cabeza intensos o clicos abdominales.  Si tiene alguna pregunta. Otros estudios que podrn realizarse durante el segundo trimestre incluyen lo siguiente:  Anlisis de sangre para detectar:  Concentraciones de hierro bajas (anemia).  Diabetes gestacional (entre la semana 24 y la 28).  Anticuerpos Rh.  Anlisis de orina para detectar infecciones, diabetes o protenas en la orina.  Una ecografa para confirmar que el beb crece y se desarrolla correctamente.  Una amniocentesis para diagnosticar posibles problemas genticos.  Estudios del feto para descartar espina   bfida y sndrome de Down. INSTRUCCIONES PARA EL CUIDADO EN EL HOGAR   Evite fumar, consumir hierbas, beber alcohol y tomar frmacos que no le hayan recetado. Estas sustancias qumicas afectan la formacin y el desarrollo del beb.  Siga  las indicaciones del mdico en relacin con el uso de medicamentos. Durante el embarazo, hay medicamentos que son seguros de tomar y otros que no.  Haga actividad fsica solo en la forma indicada por el mdico. Sentir clicos uterinos es un buen signo para detener la actividad fsica.  Contine comiendo alimentos que sanos con regularidad.  Use un sostn que le brinde buen soporte si le duelen las mamas.  No se d baos de inmersin en agua caliente, baos turcos ni saunas.  Colquese el cinturn de seguridad cuando conduzca.  No coma carne cruda ni queso sin cocinar; evite el contacto con las bandejas sanitarias de los gatos y la tierra que estos animales usan. Estos elementos contienen grmenes que pueden causar defectos congnitos en el beb.  Tome las vitaminas prenatales.  Si est estreida, pruebe un laxante suave (si el mdico lo autoriza). Consuma ms alimentos ricos en fibra, como vegetales y frutas frescos y cereales integrales. Beba gran cantidad de lquido para mantener la orina de tono claro o color amarillo plido.  Dese baos de asiento con agua tibia para aliviar el dolor o las molestias causadas por las hemorroides. Use una crema para las hemorroides si el mdico la autoriza.  Si tiene venas varicosas, use medias de descanso. Eleve los pies durante 15minutos, 3 o 4veces por da. Limite la cantidad de sal en su dieta.  No levante objetos pesados, use zapatos de tacones bajos y mantenga una buena postura.  Descanse con las piernas elevadas si tiene calambres o dolor de cintura.  Visite a su dentista si an no lo ha hecho durante el embarazo. Use un cepillo de dientes blando para higienizarse los dientes y psese el hilo dental con suavidad.  Puede seguir manteniendo relaciones sexuales, a menos que el mdico le indique lo contrario.  Concurra a todas las visitas prenatales segn las indicaciones de su mdico. SOLICITE ATENCIN MDICA SI:   Tiene mareos.  Siente  clicos leves, presin en la pelvis o dolor persistente en el abdomen.  Tiene nuseas, vmitos o diarrea persistentes.  Tiene secrecin vaginal con mal olor.  Siente dolor al orinar. SOLICITE ATENCIN MDICA DE INMEDIATO SI:   Tiene fiebre.  Tiene una prdida de lquido por la vagina.  Tiene sangrado o pequeas prdidas vaginales.  Siente dolor intenso o clicos en el abdomen.  Sube o baja de peso rpidamente.  Tiene dificultad para respirar y siente dolor de pecho.  Sbitamente se le hinchan mucho el rostro, las manos, los tobillos, los pies o las piernas.  No ha sentido los movimientos del beb durante una hora.  Siente un dolor de cabeza intenso que no se alivia con medicamentos.  Hay cambios en la visin. Document Released: 03/22/2005 Document Revised: 06/17/2013 ExitCare Patient Information 2015 ExitCare, LLC. This information is not intended to replace advice given to you by your health care provider. Make sure you discuss any questions you have with your health care provider.  

## 2014-05-27 NOTE — Progress Notes (Signed)
Pressure- front of stomach Pt reports when she vomited x 2 on Monday she had little red bumps under her eyes and she has red spot in her left eye

## 2014-06-09 ENCOUNTER — Encounter: Payer: Self-pay | Admitting: *Deleted

## 2014-06-17 ENCOUNTER — Ambulatory Visit (INDEPENDENT_AMBULATORY_CARE_PROVIDER_SITE_OTHER): Payer: Self-pay | Admitting: Obstetrics and Gynecology

## 2014-06-17 ENCOUNTER — Encounter: Payer: Self-pay | Admitting: Obstetrics and Gynecology

## 2014-06-17 VITALS — BP 118/73 | HR 88 | Temp 98.4°F | Wt 177.4 lb

## 2014-06-17 DIAGNOSIS — Z23 Encounter for immunization: Secondary | ICD-10-CM

## 2014-06-17 DIAGNOSIS — Z3493 Encounter for supervision of normal pregnancy, unspecified, third trimester: Secondary | ICD-10-CM

## 2014-06-17 DIAGNOSIS — Z3492 Encounter for supervision of normal pregnancy, unspecified, second trimester: Secondary | ICD-10-CM

## 2014-06-17 LAB — POCT URINALYSIS DIP (DEVICE)
Bilirubin Urine: NEGATIVE
GLUCOSE, UA: NEGATIVE mg/dL
HGB URINE DIPSTICK: NEGATIVE
Ketones, ur: NEGATIVE mg/dL
Nitrite: NEGATIVE
Protein, ur: NEGATIVE mg/dL
SPECIFIC GRAVITY, URINE: 1.015 (ref 1.005–1.030)
Urobilinogen, UA: 0.2 mg/dL (ref 0.0–1.0)
pH: 7.5 (ref 5.0–8.0)

## 2014-06-17 LAB — CBC
HCT: 34.2 % — ABNORMAL LOW (ref 36.0–46.0)
Hemoglobin: 11.4 g/dL — ABNORMAL LOW (ref 12.0–15.0)
MCH: 30.2 pg (ref 26.0–34.0)
MCHC: 33.3 g/dL (ref 30.0–36.0)
MCV: 90.5 fL (ref 78.0–100.0)
MPV: 10.3 fL (ref 9.4–12.4)
PLATELETS: 221 10*3/uL (ref 150–400)
RBC: 3.78 MIL/uL — ABNORMAL LOW (ref 3.87–5.11)
RDW: 13.4 % (ref 11.5–15.5)
WBC: 6.6 10*3/uL (ref 4.0–10.5)

## 2014-06-17 MED ORDER — TETANUS-DIPHTH-ACELL PERTUSSIS 5-2.5-18.5 LF-MCG/0.5 IM SUSP
0.5000 mL | Freq: Once | INTRAMUSCULAR | Status: AC
  Administered 2014-06-17: 0.5 mL via INTRAMUSCULAR

## 2014-06-17 NOTE — Progress Notes (Signed)
28 wk labs with Tdap Kim Blair Spanish Interpreter for encounter

## 2014-06-17 NOTE — Patient Instructions (Signed)
Third Trimester of Pregnancy The third trimester is from week 29 through week 42, months 7 through 9. The third trimester is a time when the fetus is growing rapidly. At the end of the ninth month, the fetus is about 20 inches in length and weighs 6-10 pounds.  BODY CHANGES Your body goes through many changes during pregnancy. The changes vary from woman to woman.   Your weight will continue to increase. You can expect to gain 25-35 pounds (11-16 kg) by the end of the pregnancy.  You may begin to get stretch marks on your hips, abdomen, and breasts.  You may urinate more often because the fetus is moving lower into your pelvis and pressing on your bladder.  You may develop or continue to have heartburn as a result of your pregnancy.  You may develop constipation because certain hormones are causing the muscles that push waste through your intestines to slow down.  You may develop hemorrhoids or swollen, bulging veins (varicose veins).  You may have pelvic pain because of the weight gain and pregnancy hormones relaxing your joints between the bones in your pelvis. Backaches may result from overexertion of the muscles supporting your posture.  You may have changes in your hair. These can include thickening of your hair, rapid growth, and changes in texture. Some women also have hair loss during or after pregnancy, or hair that feels dry or thin. Your hair will most likely return to normal after your baby is born.  Your breasts will continue to grow and be tender. A yellow discharge may leak from your breasts called colostrum.  Your belly button may stick out.  You may feel short of breath because of your expanding uterus.  You may notice the fetus "dropping," or moving lower in your abdomen.  You may have a bloody mucus discharge. This usually occurs a few days to a week before labor begins.  Your cervix becomes thin and soft (effaced) near your due date. WHAT TO EXPECT AT YOUR PRENATAL  EXAMS  You will have prenatal exams every 2 weeks until week 36. Then, you will have weekly prenatal exams. During a routine prenatal visit:  You will be weighed to make sure you and the fetus are growing normally.  Your blood pressure is taken.  Your abdomen will be measured to track your baby's growth.  The fetal heartbeat will be listened to.  Any test results from the previous visit will be discussed.  You may have a cervical check near your due date to see if you have effaced. At around 36 weeks, your caregiver will check your cervix. At the same time, your caregiver will also perform a test on the secretions of the vaginal tissue. This test is to determine if a type of bacteria, Group B streptococcus, is present. Your caregiver will explain this further. Your caregiver may ask you:  What your birth plan is.  How you are feeling.  If you are feeling the baby move.  If you have had any abnormal symptoms, such as leaking fluid, bleeding, severe headaches, or abdominal cramping.  If you have any questions. Other tests or screenings that may be performed during your third trimester include:  Blood tests that check for low iron levels (anemia).  Fetal testing to check the health, activity level, and growth of the fetus. Testing is done if you have certain medical conditions or if there are problems during the pregnancy. FALSE LABOR You may feel small, irregular contractions that   eventually go away. These are called Braxton Hicks contractions, or false labor. Contractions may last for hours, days, or even weeks before true labor sets in. If contractions come at regular intervals, intensify, or become painful, it is best to be seen by your caregiver.  SIGNS OF LABOR   Menstrual-like cramps.  Contractions that are 5 minutes apart or less.  Contractions that start on the top of the uterus and spread down to the lower abdomen and back.  A sense of increased pelvic pressure or back  pain.  A watery or bloody mucus discharge that comes from the vagina. If you have any of these signs before the 37th week of pregnancy, call your caregiver right away. You need to go to the hospital to get checked immediately. HOME CARE INSTRUCTIONS   Avoid all smoking, herbs, alcohol, and unprescribed drugs. These chemicals affect the formation and growth of the baby.  Follow your caregiver's instructions regarding medicine use. There are medicines that are either safe or unsafe to take during pregnancy.  Exercise only as directed by your caregiver. Experiencing uterine cramps is a good sign to stop exercising.  Continue to eat regular, healthy meals.  Wear a good support bra for breast tenderness.  Do not use hot tubs, steam rooms, or saunas.  Wear your seat belt at all times when driving.  Avoid raw meat, uncooked cheese, cat litter boxes, and soil used by cats. These carry germs that can cause birth defects in the baby.  Take your prenatal vitamins.  Try taking a stool softener (if your caregiver approves) if you develop constipation. Eat more high-fiber foods, such as fresh vegetables or fruit and whole grains. Drink plenty of fluids to keep your urine clear or pale yellow.  Take warm sitz baths to soothe any pain or discomfort caused by hemorrhoids. Use hemorrhoid cream if your caregiver approves.  If you develop varicose veins, wear support hose. Elevate your feet for 15 minutes, 3-4 times a day. Limit salt in your diet.  Avoid heavy lifting, wear low heal shoes, and practice good posture.  Rest a lot with your legs elevated if you have leg cramps or low back pain.  Visit your dentist if you have not gone during your pregnancy. Use a soft toothbrush to brush your teeth and be gentle when you floss.  A sexual relationship may be continued unless your caregiver directs you otherwise.  Do not travel far distances unless it is absolutely necessary and only with the approval  of your caregiver.  Take prenatal classes to understand, practice, and ask questions about the labor and delivery.  Make a trial run to the hospital.  Pack your hospital bag.  Prepare the baby's nursery.  Continue to go to all your prenatal visits as directed by your caregiver. SEEK MEDICAL CARE IF:  You are unsure if you are in labor or if your water has broken.  You have dizziness.  You have mild pelvic cramps, pelvic pressure, or nagging pain in your abdominal area.  You have persistent nausea, vomiting, or diarrhea.  You have a bad smelling vaginal discharge.  You have pain with urination. SEEK IMMEDIATE MEDICAL CARE IF:   You have a fever.  You are leaking fluid from your vagina.  You have spotting or bleeding from your vagina.  You have severe abdominal cramping or pain.  You have rapid weight loss or gain.  You have shortness of breath with chest pain.  You notice sudden or extreme swelling   of your face, hands, ankles, feet, or legs.  You have not felt your baby move in over an hour.  You have severe headaches that do not go away with medicine.  You have vision changes. Document Released: 06/06/2001 Document Revised: 06/17/2013 Document Reviewed: 08/13/2012 ExitCare Patient Information 2015 [employer]. This information is not intended to replace advice given to you by your health care provider. Make sure you discuss any questions you have with your health care provider.  

## 2014-06-17 NOTE — Progress Notes (Signed)
Explained change in due date. Nausea resolved. Scheduled F/U anatomy. 28 wk labs. Discussed FM.

## 2014-06-18 LAB — GLUCOSE TOLERANCE, 1 HOUR (50G) W/O FASTING: Glucose, 1 Hour GTT: 117 mg/dL (ref 70–140)

## 2014-06-18 LAB — HIV ANTIBODY (ROUTINE TESTING W REFLEX): HIV 1&2 Ab, 4th Generation: NONREACTIVE

## 2014-06-18 LAB — RPR

## 2014-06-24 ENCOUNTER — Ambulatory Visit (HOSPITAL_COMMUNITY)
Admission: RE | Admit: 2014-06-24 | Discharge: 2014-06-24 | Disposition: A | Discharge status: Home / Self Care | Payer: Self-pay | Source: Ambulatory Visit | Attending: Obstetrics and Gynecology | Admitting: Obstetrics and Gynecology

## 2014-06-24 DIAGNOSIS — Z3A28 28 weeks gestation of pregnancy: Secondary | ICD-10-CM | POA: Insufficient documentation

## 2014-06-24 DIAGNOSIS — Z3493 Encounter for supervision of normal pregnancy, unspecified, third trimester: Secondary | ICD-10-CM

## 2014-06-24 DIAGNOSIS — IMO0002 Reserved for concepts with insufficient information to code with codable children: Secondary | ICD-10-CM | POA: Insufficient documentation

## 2014-06-24 DIAGNOSIS — Z0489 Encounter for examination and observation for other specified reasons: Secondary | ICD-10-CM | POA: Insufficient documentation

## 2014-06-24 DIAGNOSIS — Z36 Encounter for antenatal screening of mother: Secondary | ICD-10-CM | POA: Insufficient documentation

## 2014-06-26 NOTE — L&D Delivery Note (Signed)
Patient is 24 y.o. G1P0 4211w2d admitted for induction of labor for post dates, s/p cytotec, foley bulb, and pitocin. Temperature of 101 noted prior to delivery. Tylenol given.   Delivery Note At 7:47 AM a healthy female was delivered via vacuum assisted vaginal delivery, no pop offs (Presentation: cephalic; occiput anterior).  APGAR: 8, 9; weight. Nuchal cord noted.   Placenta status: spontaneous, intact.  Cord: 3 vessels with the following complications: None.  Cord pH: N/A  Anesthesia: Epidural  Episiotomy:  None Lacerations:  Partial 3rd Degree Suture Repair: 2.0 vicryl Est. Blood Loss (mL):  300  Mom to postpartum.  Baby to Couplet care / Skin to Skin. Cytotec 800mg  PO given.  [name], [place] N 09/24/2014, 8:13 AM     `````Attestation of Attending Supervision of Advanced Practitioner: Evaluation and management procedures were performed by the PA/NP/CNM/OB Fellow under my supervision/collaboration. Chart reviewed and agree with management and plan. I was the delivering physician, with counsel of patient thru translator prior to vacuum extractor use, Amniotic fluid noted to be purulent, but without identifiable malodor, so antibiotics given x 1 dose even tho delivery of placenta and fetus completed. 2 figure of 8 sutures used on the front of the anal sphincter capsule. Remainder of 2-layer closure normal closure with 2-0 Vicryl  Adalbert Alberto V 09/25/2014 7:12 AM

## 2014-07-15 ENCOUNTER — Ambulatory Visit (INDEPENDENT_AMBULATORY_CARE_PROVIDER_SITE_OTHER): Payer: Self-pay | Admitting: Advanced Practice Midwife

## 2014-07-15 VITALS — BP 113/56 | HR 72 | Temp 98.3°F | Wt 182.9 lb

## 2014-07-15 DIAGNOSIS — Z3492 Encounter for supervision of normal pregnancy, unspecified, second trimester: Secondary | ICD-10-CM

## 2014-07-15 DIAGNOSIS — Z3482 Encounter for supervision of other normal pregnancy, second trimester: Secondary | ICD-10-CM

## 2014-07-15 DIAGNOSIS — N898 Other specified noninflammatory disorders of vagina: Secondary | ICD-10-CM

## 2014-07-15 DIAGNOSIS — O26893 Other specified pregnancy related conditions, third trimester: Secondary | ICD-10-CM

## 2014-07-15 LAB — POCT URINALYSIS DIP (DEVICE)
Bilirubin Urine: NEGATIVE
Glucose, UA: NEGATIVE mg/dL
KETONES UR: NEGATIVE mg/dL
Nitrite: NEGATIVE
PH: 7 (ref 5.0–8.0)
PROTEIN: NEGATIVE mg/dL
SPECIFIC GRAVITY, URINE: 1.015 (ref 1.005–1.030)
Urobilinogen, UA: 0.2 mg/dL (ref 0.0–1.0)

## 2014-07-15 MED ORDER — TERCONAZOLE 0.4 % VA CREA: 1.0000 | TOPICAL_CREAM | Freq: Every day | VAGINAL | Status: DC

## 2014-07-15 NOTE — Patient Instructions (Signed)
Over the counter treatment for yeast infection:  Monistat 7 (miconazole nitrate) Vaginitis monilisica (Monilial Vaginitis) La vaginitis es una inflamacin (irritacin, hinchazn) de la vagina y la vulva. Esta no es una enfermedad de transmisin sexual.  CAUSAS Este tipo de vaginitis lo causa un hongo (candida) que normalmente se encuentra en la vagina. El hongo candida se ha desarrollado hasta el punto de ocasionar problemas en el equilibrio qumico. SNTOMAS  Secrecin vaginal espesa y blanca.  Hinchazn, picazn, enrojecimiento e inflamacin de la vagina y en algunos casos de los labios vaginales (vulva).  Ardor o dolor al ConocoPhillipsorinar.  Dolor en las relaciones sexuales. DIAGNSTICO Los factores que favorecen la vaginitis moniliasica son:  Everlean Pattersontapas de virginidad y postmenopusicas.  Embarazo.  Infecciones.  Sentir cansancio, estar enferma o estresada, especialmente si ya ha sufrido este problema en el pasado.  Diabetes Buen control ayudar a disminur la probabilidad.  Pldoras anticonceptivas  Ropa interior [place] ajustada.  El uso de espumas de bao, aerosoles femeninos duchas vaginales o tampones con desodorante.  Algunos antibiticos (medicamentos que destruyen grmenes).  Si contrae alguna enfermedad puede sufrir recurrencias espordicas. TRATAMIENTO El profesional que lo asiste prescribir medicamentos.  Hay diferentes tipos de cremas y supositorios vaginales que tratan especficamente la vaginitis monilisica. Para infecciones por hongos recurrentes, utilice un supositorio o crema en la vagina dos veces por semana, o segn se le indique.  Tambin podrn utilizarse cremas con corticoides o anti monilisicas para la picazn o la irritacin de la vulva. Consulte con el profesional que la asiste.  Si la crema no da resultado, podr aplicarse en la vagina una solucin con azul de metileno.  El consumo de yogur puede prevenir este tipo de vaginitis. INSTRUCCIONES PARA EL  CUIDADO DOMICILIARIO  Tome todos los medicamentos tal como se le indic.  No mantenga relaciones sexuales hasta que el tratamiento se haya completado, o segn las indicaciones del profesional que la asiste.  Tome baos de asiento tibios.  No se aplique duchas vaginales.  No utilice tampones, especialmente los perfumados.  Use ropa interior de algodn  [employer] los pantalones ajustados y las medias tipo panty.  Comunique a sus compaeros sexuales que sufre una infeccin por hongos. Ellos deben concurrir para un control mdico si tienen sntomas como una urticaria leve o picazn.  Sus compaeros sexuales deben tratarse tambin si la infeccin es difcil de Pharmacologisteliminar.  Practique el sexo seguro - use condones  Algunos medicamentos vaginales ocasionan fallas en los condones de ltex. Los medicamentos vaginales que pueden daar los condones son:  ChiropodistCrema cleocina  Butoconazole (Femstat)  Terconazole (Terazol) supositorios vaginales  Miconazole (Monistat) (es un medicamento de venta libre) SOLICITE ATENCIN MDICA SI:  Daphane ShepherdUsted tiene una temperatura oral de ms de 38,9 C (102 F).  Si la infeccin empeora luego de 2 [address]das de tratamiento.  Si la infeccin no mejora luego de 3 [address]das de tratamiento.  Aparecen ampollas en o alrededor de la vagina.  Si aparece una hemorragia vaginal y no es el momento del perodo.  Siente dolor al ConocoPhillipsorinar.  Presenta problemas intestinales.  Tiene dolor durante las The St. Paul Travelersrelaciones sexuales. Document Released: 03/22/2005 Document Revised: 09/04/2011 [facility] Hospital[facility] Patient Information 2015 [place]ExitCare, [place]. This information is not intended to replace advice given to you by your health care provider. Make sure you discuss any questions you have with your health care provider.

## 2014-07-15 NOTE — Progress Notes (Signed)
Doing well.  Good fetal movement, denies vaginal bleeding, LOF, regular contractions. Moderate leuks in urine, pt denies urinary symptoms.  Does report yellow vaginal disharge x 10 days with some itching.Marland Kitchen. SSE with white thick discharge clinging to vaginal walls.  Wet prep sent.  Terazol 7 Rx per pt preference.  Discussed OTC medication in case Rx is too expensive.

## 2014-07-16 LAB — WET PREP, GENITAL: TRICH WET PREP: NONE SEEN

## 2014-07-29 ENCOUNTER — Ambulatory Visit (INDEPENDENT_AMBULATORY_CARE_PROVIDER_SITE_OTHER): Payer: Self-pay | Admitting: Physician Assistant

## 2014-07-29 VITALS — BP 117/58 | HR 81 | Wt 184.9 lb

## 2014-07-29 DIAGNOSIS — Z3482 Encounter for supervision of other normal pregnancy, second trimester: Secondary | ICD-10-CM

## 2014-07-29 DIAGNOSIS — Z3492 Encounter for supervision of normal pregnancy, unspecified, second trimester: Secondary | ICD-10-CM

## 2014-07-29 LAB — POCT URINALYSIS DIP (DEVICE)
Bilirubin Urine: NEGATIVE
Glucose, UA: NEGATIVE mg/dL
Ketones, ur: NEGATIVE mg/dL
Nitrite: NEGATIVE
Protein, ur: NEGATIVE mg/dL
SPECIFIC GRAVITY, URINE: 1.015 (ref 1.005–1.030)
UROBILINOGEN UA: 0.2 mg/dL (ref 0.0–1.0)
pH: 7 (ref 5.0–8.0)

## 2014-07-29 NOTE — Progress Notes (Signed)
33 weeks, stable without complaint.  Denies LOF, vag bleeding, dysuria.  Endorses good fetal movement.   PNV qd RTC 2 weeks

## 2014-07-29 NOTE — Patient Instructions (Signed)
Informacin sobre el parto prematuro  (Preterm Labor Information) El parto prematuro comienza antes de la semana 37 de embarazo. La duracin de un embarazo normal es de 39 a 41 semanas.  CAUSAS  Generalmente no hay una causa que pueda identificarse del motivo por el que una mujer comienza un trabajo de parto prematuro. Sin embargo, una de las causas conocidas ms frecuentes son las infecciones. Las infecciones del tero, el cuello, la vagina, el lquido amnitico, la vejiga, los riones y hasta de los pulmones (neumona) pueden hacer que el trabajo de parto se inicie. Otras causas que pueden sospecharse son:   Infecciones urogenitales, como infecciones por hongos y vaginosis bacteriana.   Anormalidades uterinas (forma del tero, sptum uterino, fibromas, hemorragias en la placenta).   Un cuello que ha sido operado (puede ser que no permanezca cerrado).   Malformaciones del feto.   Gestaciones mltiples (mellizos, trillizos y ms).   Ruptura del saco amnitico.  FACTORES DE RIESGO   Historia previa de parto prematuro.   Tener ruptura prematura de las membranas (RPM).   La placenta cubre la abertura del cuello (placenta previa).   La placenta se separa del tero (abrupcin placentaria).   El cuello es demasiado dbil para contener al beb en el tero (cuello incompetente).   Hay mucho lquido en el saco amnitico (polihidramnios).   Consumo de drogas o hbito de fumar durante el embarazo.   No aumentar de peso lo suficiente durante el embarazo.   Mujeres menores de 18 aos o mayores de 35 aos.   Nivel socioeconmico bajo.   Pertenecer a la raza afroamericana. SNTOMAS  Los signos y sntomas del trabajo de parto prematuro son:   Clicos similares a los menstruales, dolor abdominal o dolor de espalda.  Contracciones uterinas regulares, tan frecuentes como seis por hora, sin importar su intensidad (pueden ser suaves o dolorosas).  Contracciones que comienzan  en la parte superior del tero y se expanden hacia abajo, a la zona inferior del abdomen y la espalda.   Sensacin de aumento de presin en la pelvis.   Aparece una secrecin acuosa o sanguinolenta por la vagina.  TRATAMIENTO  Segn el tiempo del embarazo y otras circunstancias, el mdico puede indicar reposo en cama. Si es necesario, le indicarn medicamentos para detener las contracciones y para madurar los pulmones del feto. Si el trabajo de parto se inicia antes de las 34 semanas de embarazo, se recomienda la hospitalizacin. El tratamiento depende de las condiciones en que se encuentren usted y el feto.  QU DEBE HACER SI PIENSA QUE EST EN TRABAJO DE PARTO PREMATURO?  Comunquese con su mdico inmediatamente. Debe concurrir al hospital para ser controlada inmediatamente.  CMO PUEDE EVITAR EL TRABAJO DE PARTO PREMATURO EN FUTUROS EMBARAZOS?  Usted debe:   Si fuma, abandonar el hbito.  Mantener un peso saludable y evitar sustancias qumicas y drogas innecesarias.  Controlar todo tipo de infeccin.  Informe a su mdico si tiene una historia conocida de trabajo de parto prematuro. Document Released: 09/19/2007 Document Revised: 02/12/2013 ExitCare Patient Information 2015 [employer]. This information is not intended to replace advice given to you by your health care provider. Make sure you discuss any questions you have with your health care provider.  

## 2014-08-12 ENCOUNTER — Ambulatory Visit (INDEPENDENT_AMBULATORY_CARE_PROVIDER_SITE_OTHER): Payer: Self-pay | Admitting: Physician Assistant

## 2014-08-12 ENCOUNTER — Encounter: Payer: Self-pay | Admitting: Physician Assistant

## 2014-08-12 VITALS — BP 122/74 | HR 77 | Wt 182.8 lb

## 2014-08-12 DIAGNOSIS — Z3403 Encounter for supervision of normal first pregnancy, third trimester: Secondary | ICD-10-CM

## 2014-08-12 LAB — POCT URINALYSIS DIP (DEVICE)
Bilirubin Urine: NEGATIVE
Glucose, UA: NEGATIVE mg/dL
Hgb urine dipstick: NEGATIVE
Ketones, ur: NEGATIVE mg/dL
Nitrite: NEGATIVE
PH: 7.5 (ref 5.0–8.0)
PROTEIN: NEGATIVE mg/dL
Specific Gravity, Urine: 1.02 (ref 1.005–1.030)
UROBILINOGEN UA: 0.2 mg/dL (ref 0.0–1.0)

## 2014-08-12 NOTE — Patient Instructions (Signed)
Tercer trimestre de embarazo (Third Trimester of Pregnancy) El tercer trimestre va desde la semana29 hasta la 42, desde el sptimo hasta el noveno mes, y es la poca en la que el feto crece ms rpidamente. Hacia el final del noveno mes, el feto mide alrededor de 20pulgadas (45cm) de largo y pesa entre 6 y 10 libras (2,700 y 4,500kg).  CAMBIOS EN EL ORGANISMO Su organismo atraviesa por muchos cambios durante el embarazo, y estos varan de una mujer a otra.   Seguir aumentando de peso. Es de esperar que aumente entre 25 y 35libras (11 y 16kg) hacia el final del embarazo.  Podrn aparecer las primeras estras en las caderas, el abdomen y las mamas.  Puede tener necesidad de orinar con ms frecuencia porque el feto baja hacia la pelvis y ejerce presin sobre la vejiga.  Debido al embarazo podr sentir acidez estomacal con frecuencia.  Puede estar estreida, ya que ciertas hormonas enlentecen los movimientos de los msculos que empujan los desechos a travs de los intestinos.  Pueden aparecer hemorroides o abultarse e hincharse las venas (venas varicosas).  Puede sentir dolor plvico debido al aumento de peso y a que las hormonas del embarazo relajan las articulaciones entre los huesos de la pelvis. El dolor de espalda puede ser consecuencia de la sobrecarga de los msculos que soportan la postura.  Tal vez haya cambios en el cabello que pueden incluir su engrosamiento, crecimiento rpido y cambios en la textura. Adems, a algunas mujeres se les cae el cabello durante o despus del embarazo, o tienen el cabello seco o fino. Lo ms probable es que el cabello se le normalice despus del nacimiento del beb.  Las mamas seguirn creciendo y le dolern. A veces, puede haber una secrecin amarilla de las mamas llamada calostro.  El ombligo puede salir hacia afuera.  Puede sentir que le falta el aire debido a que se expande el tero.  Puede notar que el feto "baja" o lo siente ms bajo, en el  abdomen.  Puede tener una prdida de secrecin mucosa con sangre. Esto suele ocurrir en el trmino de unos pocos das a una semana antes de que comience el trabajo de parto.  El cuello del tero se vuelve delgado y blando (se borra) cerca de la fecha de parto. QU DEBE ESPERAR EN LOS EXMENES PRENATALES  Le harn exmenes prenatales cada 2semanas hasta la semana36. A partir de ese momento le harn exmenes semanales. Durante una visita prenatal de rutina:  La pesarn para asegurarse de que usted y el feto estn creciendo normalmente.  Le tomarn la presin arterial.  Le medirn el abdomen para controlar el desarrollo del beb.  Se escucharn los latidos cardacos fetales.  Se evaluarn los resultados de los estudios solicitados en visitas anteriores.  Le revisarn el cuello del tero cuando est prxima la fecha de parto para controlar si este se ha borrado. Alrededor de la semana36, el mdico le revisar el cuello del tero. Al mismo tiempo, realizar un anlisis de las secreciones del tejido vaginal. Este examen es para determinar si hay un tipo de bacteria, estreptococo Grupo B. El mdico le explicar esto con ms detalle. El mdico puede preguntarle lo siguiente:  Cmo le gustara que fuera el parto.  Cmo se siente.  Si siente los movimientos del beb.  Si ha tenido sntomas anormales, como prdida de lquido, sangrado, dolores de cabeza intensos o clicos abdominales.  Si tiene alguna pregunta. Otros exmenes o estudios de deteccin que pueden realizarse   durante el tercer trimestre incluyen lo siguiente:  Anlisis de sangre para controlar las concentraciones de hierro (anemia).  Controles fetales para determinar su salud, nivel de actividad y crecimiento. Si tiene alguna enfermedad o hay problemas durante el embarazo, le harn estudios. FALSO TRABAJO DE PARTO Es posible que sienta contracciones leves e irregulares que finalmente desaparecen. Se llaman contracciones de  Braxton Hicks o falso trabajo de parto. Las contracciones pueden durar horas, das o incluso semanas, antes de que el verdadero trabajo de parto se inicie. Si las contracciones ocurren a intervalos regulares, se intensifican o se hacen dolorosas, lo mejor es que la revise el mdico.  SIGNOS DE TRABAJO DE PARTO   Clicos de tipo menstrual.  Contracciones cada 5minutos o menos.  Contracciones que comienzan en la parte superior del tero y se extienden hacia abajo, a la zona inferior del abdomen y la espalda.  Sensacin de mayor presin en la pelvis o dolor de espalda.  Una secrecin de mucosidad acuosa o con sangre que sale de la vagina. Si tiene alguno de estos signos antes de la semana37 del embarazo, llame a su mdico de inmediato. Debe concurrir al hospital para que la controlen inmediatamente. INSTRUCCIONES PARA EL CUIDADO EN EL HOGAR   Evite fumar, consumir hierbas, beber alcohol y tomar frmacos que no le hayan recetado. Estas sustancias qumicas afectan la formacin y el desarrollo del beb.  Siga las indicaciones del mdico en relacin con el uso de medicamentos. Durante el embarazo, hay medicamentos que son seguros de tomar y otros que no.  Haga actividad fsica solo en la forma indicada por el mdico. Sentir clicos uterinos es un buen signo para detener la actividad fsica.  Contine comiendo alimentos que sanos con regularidad.  Use un sostn que le brinde buen soporte si le duelen las mamas.  No se d baos de inmersin en agua caliente, baos turcos ni saunas.  Colquese el cinturn de seguridad cuando conduzca.  No coma carne cruda ni queso sin cocinar; evite el contacto con las bandejas sanitarias de los gatos y la tierra que estos animales usan. Estos elementos contienen grmenes que pueden causar defectos congnitos en el beb.  Tome las vitaminas prenatales.  Si est estreida, pruebe un laxante suave (si el mdico lo autoriza). Consuma ms alimentos ricos en  fibra, como vegetales y frutas frescos y cereales integrales. Beba gran cantidad de lquido para mantener la orina de tono claro o color amarillo plido.  Dese baos de asiento con agua tibia para aliviar el dolor o las molestias causadas por las hemorroides. Use una crema para las hemorroides si el mdico la autoriza.  Si tiene venas varicosas, use medias de descanso. Eleve los pies durante 15minutos, 3 o 4veces por da. Limite la cantidad de sal en su dieta.  Evite levantar objetos pesados, use zapatos de tacones bajos y mantenga una buena postura.  Descanse con las piernas elevadas si tiene calambres o dolor de cintura.  Visite a su dentista si no lo ha hecho durante el embarazo. Use un cepillo de dientes blando para higienizarse los dientes y psese el hilo dental con suavidad.  Puede seguir manteniendo relaciones sexuales, a menos que el mdico le indique lo contrario.  No haga viajes largos excepto que sea absolutamente necesario y solo con la autorizacin del mdico.  Tome clases prenatales para entender, practicar y hacer preguntas sobre el trabajo de parto y el parto.  Haga un ensayo de la partida al hospital.  Prepare el bolso que   llevar al hospital.  Prepare la habitacin del beb.  Concurra a todas las visitas prenatales segn las indicaciones de su mdico. SOLICITE ATENCIN MDICA SI:  No est segura de que est en trabajo de parto o de que ha roto la bolsa de las aguas.  Tiene mareos.  Siente clicos leves, presin en la pelvis o dolor persistente en el abdomen.  Tiene nuseas, vmitos o diarrea persistentes.  Tiene secrecin vaginal con mal olor.  Siente dolor al orinar. SOLICITE ATENCIN MDICA DE INMEDIATO SI:   Tiene fiebre.  Tiene una prdida de lquido por la vagina.  Tiene sangrado o pequeas prdidas vaginales.  Siente dolor intenso o clicos en el abdomen.  Sube o baja de peso rpidamente.  Tiene dificultad para respirar y siente dolor de  pecho.  Sbitamente se le hinchan mucho el rostro, las manos, los tobillos, los pies o las piernas.  No ha sentido los movimientos del beb durante una hora.  Siente un dolor de cabeza intenso que no se alivia con medicamentos.  Hay cambios en la visin. Document Released: 03/22/2005 Document Revised: 06/17/2013 [employer] Patient Information 2015 [employer]. This information is not intended to replace advice given to you by your health care provider. Make sure you discuss any questions you have with your health care provider.  

## 2014-08-12 NOTE — Progress Notes (Signed)
35 weeks, without complaint.  Endorses good fetal movement.  Denies LOF, vag bleeding, dysuria.   Labor precautions discussed RTC 1 week for GBS/GC/chlam testing

## 2014-08-14 LAB — CULTURE, OB URINE: Colony Count: 60000

## 2014-08-21 ENCOUNTER — Ambulatory Visit (INDEPENDENT_AMBULATORY_CARE_PROVIDER_SITE_OTHER): Payer: Self-pay | Admitting: Advanced Practice Midwife

## 2014-08-21 ENCOUNTER — Other Ambulatory Visit: Payer: Self-pay | Admitting: Advanced Practice Midwife

## 2014-08-21 VITALS — BP 114/74 | HR 76 | Temp 98.3°F | Wt 188.7 lb

## 2014-08-21 DIAGNOSIS — Z3492 Encounter for supervision of normal pregnancy, unspecified, second trimester: Secondary | ICD-10-CM

## 2014-08-21 DIAGNOSIS — Z3482 Encounter for supervision of other normal pregnancy, second trimester: Secondary | ICD-10-CM

## 2014-08-21 LAB — OB RESULTS CONSOLE GC/CHLAMYDIA
CHLAMYDIA, DNA PROBE: NEGATIVE
GC PROBE AMP, GENITAL: NEGATIVE

## 2014-08-21 LAB — POCT URINALYSIS DIP (DEVICE)
BILIRUBIN URINE: NEGATIVE
Glucose, UA: NEGATIVE mg/dL
HGB URINE DIPSTICK: NEGATIVE
Ketones, ur: NEGATIVE mg/dL
Nitrite: NEGATIVE
Protein, ur: NEGATIVE mg/dL
SPECIFIC GRAVITY, URINE: 1.02 (ref 1.005–1.030)
Urobilinogen, UA: 0.2 mg/dL (ref 0.0–1.0)
pH: 7 (ref 5.0–8.0)

## 2014-08-21 LAB — OB RESULTS CONSOLE GBS: STREP GROUP B AG: NEGATIVE

## 2014-08-21 NOTE — Patient Instructions (Signed)
Contracciones de Braxton Hicks (Braxton Hicks Contractions) Durante el embarazo, pueden presentarse contracciones uterinas que no siempre indican que est en trabajo de parto.  QU SON LAS CONTRACCIONES DE BRAXTON HICKS?  Las contracciones que se presentan antes del trabajo de parto se conocen como contracciones de Braxton Hicks o falso trabajo de parto. Hacia el final del embarazo (32 a 34semanas), estas contracciones pueden aparecen con ms frecuencia y volverse ms intensas. No corresponden al trabajo de parto verdadero porque estas contracciones no producen el agrandamiento (la dilatacin) y el afinamiento del cuello del tero. Algunas veces, es difcil distinguirlas del trabajo de parto verdadero porque en algunos casos pueden ser muy intensas, y las personas tienen diferentes niveles de tolerancia al dolor. No debe sentirse avergonzada si concurre al hospital con falso trabajo de parto. En ocasiones, la nica forma de saber si el trabajo de parto es verdadero es que el mdico determine si hay cambios en el cuello del tero. Si no hay problemas prenatales u otras complicaciones de salud asociadas con el embarazo, no habr inconvenientes si la envan a su casa con falso trabajo de parto y espera que comience el verdadero. CMO DIFERENCIAR EL TRABAJO DE PARTO FALSO DEL VERDADERO Falso trabajo de parto  Las contracciones del falso trabajo de parto duran menos y no son tan intensas como las verdaderas.  Generalmente son irregulares.  A menudo, se sienten en la parte delantera de la parte baja del abdomen y en la ingle,  y pueden desaparecer cuando camina o cambia de posicin mientras est acostada.  Las contracciones se vuelven ms dbiles y su duracin es menor a medida que el tiempo transcurre.  Por lo general, no se hacen progresivamente ms intensas, regulares y cercanas entre s como en el caso del trabajo de parto verdadero. Verdadero trabajo de parto 1. Las contracciones del verdadero  trabajo de parto duran de 30 a 70segundos, son muy regulares y suelen volverse ms intensas, y aumenta su frecuencia. 2. No desaparecen cuando camina. 3. La molestia generalmente se siente en la parte superior del tero y se extiende hacia la zona inferior del abdomen y hacia la cintura. 4. El mdico podr examinarla para determinar si el trabajo de parto es verdadero. El examen mostrar si el cuello del tero se est dilatando y afinando. LO QUE DEBE RECORDAR  Contine haciendo los ejercicios habituales y siga otras indicaciones que el mdico le d.  Tome todos los medicamentos como le indic el mdico.  Concurra a las visitas prenatales regulares.  Coma y beba con moderacin si cree que est en trabajo de parto.  Si las contracciones de Braxton Hicks le provocan incomodidad:  Cambie de posicin: si est acostada o descansando, camine; si est caminando, descanse.  Sintese y descanse en una baera con agua tibia.  Beba 2 o 3vasos de agua. La deshidratacin puede provocar contracciones.  Respire lenta y profundamente varias veces por hora. CUNDO DEBO BUSCAR ASISTENCIA MDICA INMEDIATA? Solicite atencin mdica de inmediato si:  Las contracciones se intensifican, se hacen ms regulares y cercanas entre s.  Tiene una prdida de lquido por la vagina.  Tiene fiebre.  Elimina mucosidad manchada con sangre.  Tiene una hemorragia vaginal abundante.  Tiene dolor abdominal permanente.  Tiene un dolor en la zona lumbar que nunca tuvo antes.  Siente que la cabeza del beb empuja hacia abajo y ejerce presin en la zona plvica.  El beb no se mueve tanto como sola. Document Released: 03/22/2005 Document Revised: 06/17/2013 [employer] Patient   Information 2015 [employer]. This information is not intended to replace advice given to you by your health care provider. Make sure you discuss any questions you have with your health care provider.  Evaluacin de los movimientos  fetales  (Fetal Movement Counts) Nombre del paciente: __________________________________________________ Fecha de parto estimada: ____________________ La evaluacin de los movimientos fetales es muy recomendable en los embarazos de alto riesgo, pero tambin es una buena idea que lo hagan todas las embarazadas. El mdico le indicar que comience a contarlos a las 28 semanas de embarazo. Los movimientos fetales suelen aumentar:   Despus de una comida completa.  Despus de la actividad fsica.  Despus de comer o beber algo dulce o fro.  En reposo. Preste atencin cuando sienta que el beb est ms activo. Esto le ayudar a notar un patrn de ciclos de vigilia y sueo de su beb y cules son los factores que contribuyen a un aumento de los movimientos fetales. Es importante llevar a cabo un recuento de movimientos fetales, al mismo tiempo cada da, cuando el beb normalmente est ms activo.  CMO CONTAR LOS MOVIMIENTOS FETALES 5. Busque un lugar tranquilo y cmodo para sentarse o recostarse sobre el lado izquierdo. Al recostarse sobre su lado izquierdo, le proporciona una mejor circulacin de sangre y oxgeno al beb. 6. Anote el da y la hora en una hoja de papel o en un diario. 7. Comience contando las pataditas, revoloteos, chasquidos, vueltas o pinchazos en un perodo de 2 horas. Debe sentir al menos 10 movimientos en 2 horas. 8. Si no siente 10 movimientos en 2 horas, espere 2  3 horas y cuente de nuevo. Busque cambios en el patrn o si no cuenta lo suficiente en 2 horas. SOLICITE ATENCIN MDICA SI:   Siente menos de 10 pataditas en 2 horas, en dos intentos.  No hay movimientos durante una hora.  El patrn se modifica o le lleva ms tiempo cada da contar las 10 pataditas.  Siente que el beb no se mueve como lo hace habitualmente. Fecha: ____________ Movimientos: ____________ Hora de inicio: ____________ Hora de finalizacin: ____________  Fecha: ____________ Movimientos:  ____________ Hora de inicio: ____________ Hora de finalizacin: ____________  Fecha: ____________ Movimientos: ____________ Hora de inicio: ____________ Hora de finalizacin: ____________  Fecha: ____________ Movimientos: ____________ Hora de inicio: ____________ Hora de finalizacin: ____________  Fecha: ____________ Movimientos: ____________ Hora de inicio: ____________ Hora de finalizacin: ____________  Fecha: ____________ Movimientos: ____________ Hora de inicio: ____________ Hora de finalizacin: ____________  Fecha: ____________ Movimientos: ____________ Hora de inicio: ____________ Hora de finalizacin: ____________  Fecha: ____________ Movimientos: ____________ Hora de inicio: ____________ Hora de finalizacin: ____________  Fecha: ____________ Movimientos: ____________ Hora de inicio: ____________ Hora de finalizacin: ____________  Fecha: ____________ Movimientos: ____________ Hora de inicio: ____________ Hora de finalizacin: ____________  Fecha: ____________ Movimientos: ____________ Hora de inicio: ____________ Hora de finalizacin: ____________  Fecha: ____________ Movimientos: ____________ Hora de inicio: ____________ Hora de finalizacin: ____________  Fecha: ____________ Movimientos: ____________ Hora de inicio: ____________ Hora de finalizacin: ____________  Fecha: ____________ Movimientos: ____________ Hora de inicio: ____________ Hora de finalizacin: ____________  Fecha: ____________ Movimientos: ____________ Hora de inicio: ____________ Hora de finalizacin: ____________  Fecha: ____________ Movimientos: ____________ Hora de inicio: ____________ Hora de finalizacin: ____________  Fecha: ____________ Movimientos: ____________ Hora de inicio: ____________ Hora de finalizacin: ____________  Fecha: ____________ Movimientos: ____________ Hora de inicio: ____________ Hora de finalizacin: ____________  Fecha: ____________ Movimientos: ____________ Hora de inicio: ____________  Hora de finalizacin: ____________  Fecha:   ____________ Movimientos: ____________ Hora de inicio: ____________ Hora de finalizacin: ____________  Fecha: ____________ Movimientos: ____________ Hora de inicio: ____________ Hora de finalizacin: ____________  Fecha: ____________ Movimientos: ____________ Hora de inicio: ____________ Hora de finalizacin: ____________  Fecha: ____________ Movimientos: ____________ Hora de inicio: ____________ Hora de finalizacin: ____________  Fecha: ____________ Movimientos: ____________ Hora de inicio: ____________ Hora de finalizacin: ____________  Fecha: ____________ Movimientos: ____________ Hora de inicio: ____________ Hora de finalizacin: ____________  Fecha: ____________ Movimientos: ____________ Hora de inicio: ____________ Hora de finalizacin: ____________  Fecha: ____________ Movimientos: ____________ Hora de inicio: ____________ Hora de finalizacin: ____________  Fecha: ____________ Movimientos: ____________ Hora de inicio: ____________ Hora de finalizacin: ____________  Fecha: ____________ Movimientos: ____________ Hora de inicio: ____________ Hora de finalizacin: ____________  Fecha: ____________ Movimientos: ____________ Hora de inicio: ____________ Hora de finalizacin: ____________  Fecha: ____________ Movimientos: ____________ Hora de inicio: ____________ Hora de finalizacin: ____________  Fecha: ____________ Movimientos: ____________ Hora de inicio: ____________ Hora de finalizacin: ____________  Fecha: ____________ Movimientos: ____________ Hora de inicio: ____________ Hora de finalizacin: ____________  Fecha: ____________ Movimientos: ____________ Hora de inicio: ____________ Hora de finalizacin: ____________  Fecha: ____________ Movimientos: ____________ Hora de inicio: ____________ Hora de finalizacin: ____________  Fecha: ____________ Movimientos: ____________ Hora de inicio: ____________ Hora de finalizacin: ____________  Fecha:  ____________ Movimientos: ____________ Hora de inicio: ____________ Hora de finalizacin: ____________  Fecha: ____________ Movimientos: ____________ Hora de inicio: ____________ Hora de finalizacin: ____________  Fecha: ____________ Movimientos: ____________ Hora de inicio: ____________ Hora de finalizacin: ____________  Fecha: ____________ Movimientos: ____________ Hora de inicio: ____________ Hora de finalizacin: ____________  Fecha: ____________ Movimientos: ____________ Hora de inicio: ____________ Hora de finalizacin: ____________  Fecha: ____________ Movimientos: ____________ Hora de inicio: ____________ Hora de finalizacin: ____________  Fecha: ____________ Movimientos: ____________ Hora de inicio: ____________ Hora de finalizacin: ____________  Fecha: ____________ Movimientos: ____________ Hora de inicio: ____________ Hora de finalizacin: ____________  Fecha: ____________ Movimientos: ____________ Hora de inicio: ____________ Hora de finalizacin: ____________  Fecha: ____________ Movimientos: ____________ Hora de inicio: ____________ Hora de finalizacin: ____________  Fecha: ____________ Movimientos: ____________ Hora de inicio: ____________ Hora de finalizacin: ____________  Fecha: ____________ Movimientos: ____________ Hora de inicio: ____________ Hora de finalizacin: ____________  Fecha: ____________ Movimientos: ____________ Hora de inicio: ____________ Hora de finalizacin: ____________  Fecha: ____________ Movimientos: ____________ Hora de inicio: ____________ Hora de finalizacin: ____________  Fecha: ____________ Movimientos: ____________ Hora de inicio: ____________ Hora de finalizacin: ____________  Fecha: ____________ Movimientos: ____________ Hora de inicio: ____________ Hora de finalizacin: ____________  Fecha: ____________ Movimientos: ____________ Hora de inicio: ____________ Hora de finalizacin: ____________  Fecha: ____________ Movimientos: ____________ Hora  de inicio: ____________ Hora de finalizacin: ____________  Fecha: ____________ Movimientos: ____________ Hora de inicio: ____________ Hora de finalizacin: ____________  Fecha: ____________ Movimientos: ____________ Hora de inicio: ____________ Hora de finalizacin: ____________  Document Released: 09/19/2007 Document Revised: 05/29/2012 [employer] Patient Information 2015 [employer]. This information is not intended to replace advice given to you by your health care provider. Make sure you discuss any questions you have with your health care provider.  

## 2014-08-21 NOTE — Progress Notes (Signed)
[employer] interpreter # [id number] Edema- feet/ face

## 2014-08-21 NOTE — Progress Notes (Signed)
GBS today. Doing well.

## 2014-08-22 LAB — GC/CHLAMYDIA PROBE AMP
CT PROBE, AMP APTIMA: NEGATIVE
GC Probe RNA: NEGATIVE

## 2014-08-24 LAB — CULTURE, BETA STREP (GROUP B ONLY)

## 2014-08-28 ENCOUNTER — Ambulatory Visit (INDEPENDENT_AMBULATORY_CARE_PROVIDER_SITE_OTHER): Payer: Self-pay | Admitting: Physician Assistant

## 2014-08-28 VITALS — BP 133/78 | HR 70 | Wt 190.1 lb

## 2014-08-28 DIAGNOSIS — R51 Headache: Secondary | ICD-10-CM

## 2014-08-28 DIAGNOSIS — O98813 Other maternal infectious and parasitic diseases complicating pregnancy, third trimester: Secondary | ICD-10-CM

## 2014-08-28 DIAGNOSIS — O219 Vomiting of pregnancy, unspecified: Secondary | ICD-10-CM

## 2014-08-28 DIAGNOSIS — Z3403 Encounter for supervision of normal first pregnancy, third trimester: Secondary | ICD-10-CM

## 2014-08-28 DIAGNOSIS — B373 Candidiasis of vulva and vagina: Secondary | ICD-10-CM

## 2014-08-28 DIAGNOSIS — B3731 Acute candidiasis of vulva and vagina: Secondary | ICD-10-CM

## 2014-08-28 DIAGNOSIS — O26893 Other specified pregnancy related conditions, third trimester: Secondary | ICD-10-CM

## 2014-08-28 LAB — POCT URINALYSIS DIP (DEVICE)
Bilirubin Urine: NEGATIVE
Glucose, UA: NEGATIVE mg/dL
KETONES UR: NEGATIVE mg/dL
Nitrite: NEGATIVE
PH: 7 (ref 5.0–8.0)
PROTEIN: NEGATIVE mg/dL
Specific Gravity, Urine: 1.01 (ref 1.005–1.030)
Urobilinogen, UA: 0.2 mg/dL (ref 0.0–1.0)

## 2014-08-28 MED ORDER — TERCONAZOLE 0.4 % VA CREA: 1.0000 | TOPICAL_CREAM | Freq: Every day | VAGINAL | Status: DC

## 2014-08-28 NOTE — Progress Notes (Signed)
37 weeks, stable.  Denies dysuria, LOF, vaginal bleeding.  Does complain of discharge.  Endorses good fetal movement.  Complaining of some intermittent nausea and headaches. Spec exam reveals large amt of yellowish clumpy adherent discharge.  No pooling of liquid.   Clinical Vaginal Yeast - rx sent for Terazol Nausea - pt has active rx for Reglan should this be needed Headache - use OTC Tylenol per package directions RTC 1 week Labor precautions discussed

## 2014-08-28 NOTE — Patient Instructions (Signed)
Vulvovaginitis Candidiásica °(Candidal Vulvovaginitis) °La vulvovaginitis candidiásica es una infección de la vagina y la vulva. La vulva es la piel que rodea la abertura de la vagina. Puede causar picazón y molestias dentro de y alrededor de la vagina.  °CUIDADOS EN EL HOGAR °· Sólo tome medicamentos como lo indique su médico. °· No mantenga relaciones sexuales hasta que la infección haya curado o según le indique el médico. °· Practique sexo seguro. °· Informe a su compañero sexual acerca de su infección. °· No tome duchas vaginales ni use tampones. °· Use ropa interior de algodón. No utilice pantalones ni pantimedias ajustados. °· Coma yogur. Esto puede ayudar a tratar y prevenir las infecciones por cándida. °SOLICITE AYUDA DE INMEDIATO SI:  °· Tiene fiebre. °· Los problemas empeoran durante el tratamiento, o si no mejora luego de 3 días. °· Tiene malestar, irritación, o picazón en la zona de la vagina o la vulva. °· Siente dolor en al mantener relaciones sexuales. °· Comienza a sentir dolor abdominal. °ASEGÚRESE DE QUE: °· Comprende estas instrucciones. °· Controlará su enfermedad. °· Solicitará ayuda de inmediato si no mejora o empeora. °Document Released: 07/15/2010 Document Revised: 06/17/2013 °Exit[employer]® Patient Information ©2015 [employer]. This information is not intended to replace advice given to you by your health care provider. Make sure you discuss any questions you have with your health care provider. ° °

## 2014-08-28 NOTE — Progress Notes (Signed)
Pt reports a thin discharge slightly watery.

## 2014-08-29 LAB — CULTURE, OB URINE: Colony Count: 25000

## 2014-09-08 ENCOUNTER — Ambulatory Visit (INDEPENDENT_AMBULATORY_CARE_PROVIDER_SITE_OTHER): Payer: Self-pay | Admitting: Family

## 2014-09-08 VITALS — BP 118/68 | HR 67 | Temp 98.3°F | Wt 193.9 lb

## 2014-09-08 DIAGNOSIS — Z3492 Encounter for supervision of normal pregnancy, unspecified, second trimester: Secondary | ICD-10-CM

## 2014-09-08 DIAGNOSIS — Z3482 Encounter for supervision of other normal pregnancy, second trimester: Secondary | ICD-10-CM

## 2014-09-08 LAB — POCT URINALYSIS DIP (DEVICE)
BILIRUBIN URINE: NEGATIVE
GLUCOSE, UA: NEGATIVE mg/dL
Hgb urine dipstick: NEGATIVE
KETONES UR: NEGATIVE mg/dL
Nitrite: NEGATIVE
Protein, ur: NEGATIVE mg/dL
SPECIFIC GRAVITY, URINE: 1.02 (ref 1.005–1.030)
Urobilinogen, UA: 0.2 mg/dL (ref 0.0–1.0)
pH: 7 (ref 5.0–8.0)

## 2014-09-08 NOTE — Progress Notes (Signed)
Pt reports having pressure with breathing, unable to get deep breath.  Happens 1-2x/week, lasts approximately 20 minutes.  Lungs CTA.   Denies chest pain.  Explained normalcy of feeling SOB towards end of pregnancy.  Report chest pain.  Reviewed signs of labor.  Also discussed that NST will be completed at next visit.  IOL at 41 wks.

## 2014-09-08 NOTE — Progress Notes (Signed)
Edema- feet   Pt reports feeling pressure on chest giving her SOB last episode was Saturday; O2 sats today 100% RA

## 2014-09-15 ENCOUNTER — Encounter: Payer: Self-pay | Admitting: Physician Assistant

## 2014-09-15 ENCOUNTER — Ambulatory Visit (INDEPENDENT_AMBULATORY_CARE_PROVIDER_SITE_OTHER): Payer: Medicaid Other | Admitting: Physician Assistant

## 2014-09-15 VITALS — BP 127/99 | HR 73 | Temp 98.2°F | Wt 195.8 lb

## 2014-09-15 DIAGNOSIS — Z3493 Encounter for supervision of normal pregnancy, unspecified, third trimester: Secondary | ICD-10-CM

## 2014-09-15 DIAGNOSIS — O480 Post-term pregnancy: Secondary | ICD-10-CM | POA: Diagnosis present

## 2014-09-15 LAB — POCT URINALYSIS DIP (DEVICE)
Bilirubin Urine: NEGATIVE
GLUCOSE, UA: NEGATIVE mg/dL
KETONES UR: NEGATIVE mg/dL
Nitrite: NEGATIVE
PROTEIN: NEGATIVE mg/dL
SPECIFIC GRAVITY, URINE: 1.015 (ref 1.005–1.030)
UROBILINOGEN UA: 0.2 mg/dL (ref 0.0–1.0)
pH: 7 (ref 5.0–8.0)

## 2014-09-15 NOTE — Progress Notes (Signed)
40 weeks today.  Endorses good fetal movement.  Denies LOF, vaginal bleeding, dysuria.   NST today. Will schedule twice weekly testing and IOL scheduled for 3/29. Labor precautions

## 2014-09-15 NOTE — Progress Notes (Signed)
[employer] [place] used for interpreter

## 2014-09-15 NOTE — Patient Instructions (Signed)
Induccin del trabajo de parto  (Labor Induction ) Se denomina induccin del trabajo de parto cuando se inician acciones para hacer que una mujer embarazada comience el trabajo de parto. La mayora de las mujeres comienzan el trabajo de parto sin ayuda entre las semanas 37 y 42 del embarazo. Cuando esto no ocurre o cuando hay una necesidad mdica, pueden utilizarse diferentes mtodos para inducirlo. La induccin del trabajo de parto hace que el tero se contraiga. Tambin hace que el cuello del tero se ablandemadure), se abra (se dilate), y se afine (se borre). Generalmente el trabajo de parto no se induce antes de las 39 semanas excepto que haya un problema con el beb o con la madre.  Antes de inducir el trabajo de parto, el mdico considerar cierto nmero de factores incluyendo los siguientes:  El estado del beb.  Cuntas semanas tiene de embarazo.  La madurez de los pulmones del beb.  El estado del cuello del tero.  La posicin del beb. CULES SON LOS MOTIVOS PARA INDUCIR UN PARTO? El trabajo de parto puede inducirse por las siguientes razones:  La salud del beb o de la madre estn en riesgo.  El embarazo se ha pasado de trmino en 1 semana o ms.  Ha roto la bolsa de aguas pero no se ha iniciado el trabajo de parto por s mismo.  La madre tiene algn trastorno de salud o una enfermedad grave, como hipertensin arterial, una infeccin, desprendimiento abrupto de la placenta o diabetes.  Hay escaso lquido amnitico alrededor del beb.  El beb presenta sufrimiento. La conveniencia o el deseo de que el beb nazca en una cierta fecha no es un motivo para inducir el parto. CULES SON LOS MTODOS UTILIZADOS PARA INDUCIR EL TRABAJO DE PARTO? Algunos mtodos de induccin del trabajo de parto son:   Administracin del medicamentos prostaglandina. Este medicamento hace que el cuello uterino se dilate y madure. Este medicamento tambin iniciar las contracciones. Puede tomarse por  boca o insertarse en la vagina en forma de supositorio.  Insercin en la vagina de un tubo delgado (catter) con un baln en el extremo para dilatar el cuello del tero. Una vez insertado, el baln se infla con agua, lo que provoca la apertura del cuello del tero.  Ruptura de las membranas. El mdico separa el saco amnitico del cuello uterino, haciendo que el cuello uterino se distienda y cause la liberacin de la hormona llamada progesterona. Esto hace que el tero se contraiga. Este procedimiento se realiza durante una visita al consultorio mdico. Le indicarn que vuelva a su casa y espere que se inicien las contracciones. Luego tendr que volver para la induccin.  Ruptura de la bolsa de aguas. El mdico romper el saco amnitico con un pequeo instrumento. Una vez que el saco amnitico se rompe, las contracciones deben comenzar. Pueden pasar algunas horas hasta que haga efecto.  Medicamentos que desencadenen o intensifiquen las contracciones. Se lo administrarn a travs de un catter por va intravenosa (IV) que se inserta en una de las venas del brazo. Todos los mtodos de induccin, excepto la ruptura de membranas, se realizan en el hospital. La induccin se realizar en el hospital, de modo que usted y el beb puedan ser controlados cuidadosamente.  CUNTO TIEMPO LLEVA INDUCIR EL TRABAJO DE PARTO? Algunas inducciones pueden demorar entre 2 y 3 das. Generalmente lleva menos tiempo, dependiendo del estado del cuello del tero. Puede tomar ms tiempo si la induccin se realiza en etapas tempranas del embarazo   o es su primer embarazo. Si han pasado 2 o 3 das y no se inicia el trabajo de parto, podrn enviarla a su casa o realizar una cesrea. CULES SON LOS RIESGOS ASOCIADOS CON LA INDUCCiN DEL TRABAJO DE PARTO? Algunos de los riesgos de la induccin son:   Cambios en la frecuencia cardaca fetal, por ejemplo los latidos son demasiado rpidos, o lentos, o errticos.  Riesgo de distrs  fetal.  Posibilidad de infeccin en la madre o el beb.  Aumento de la posibilidad de que sea necesaria una cesrea.  Ruptura (abrupcin) de la placenta del tero (raro).  Ruptura uterina (muy raro). Cuando es necesario realizar la induccin por razones mdicas, los beneficios deben superar a los riesgos. CULES SON ALGUNAS RAZONES PARA NO INDUCIR EL TRABAJO DE PARTO? La induccin no debe realizarse si:   Se demuestra que el beb no tolera el trabajo de parto.  Fue sometida anteriormente a cirugas en el tero, como una miomectoma o le han extirpado fibromas.  La placenta est en una posicin muy baja en el tero y obstruye la abertura del cuello (placenta previa).  El beb no est ubicado con la cabeza hacia bajo.  El cordn umbilical cae hacia el canal de parto, adelante del beb. Esto puede cortar el suministro de sangre y oxgeno al beb.  Fue sometida a una cesrea anteriormente.  Hay circunstancias poco habituales, como que el beb es extremadamente prematuro. Document Released: 09/19/2007 Document Revised: 02/12/2013 ExitCare Patient Information 2015 [employer]. This information is not intended to replace advice given to you by your health care provider. Make sure you discuss any questions you have with your health care provider.  

## 2014-09-15 NOTE — Progress Notes (Signed)
IOL scheduled 3/29 @ 1930.

## 2014-09-16 ENCOUNTER — Encounter (HOSPITAL_COMMUNITY): Payer: Self-pay | Admitting: *Deleted

## 2014-09-16 ENCOUNTER — Telehealth (HOSPITAL_COMMUNITY): Payer: Self-pay | Admitting: *Deleted

## 2014-09-16 LAB — CULTURE, OB URINE
Colony Count: NO GROWTH
ORGANISM ID, BACTERIA: NO GROWTH

## 2014-09-16 NOTE — Telephone Encounter (Signed)
Interpreter [phone]

## 2014-09-16 NOTE — Telephone Encounter (Signed)
Preadmission screen  

## 2014-09-17 ENCOUNTER — Ambulatory Visit (INDEPENDENT_AMBULATORY_CARE_PROVIDER_SITE_OTHER): Payer: Medicaid Other | Admitting: *Deleted

## 2014-09-17 VITALS — BP 111/64 | HR 72

## 2014-09-17 DIAGNOSIS — O480 Post-term pregnancy: Secondary | ICD-10-CM | POA: Diagnosis not present

## 2014-09-17 LAB — US OB FOLLOW UP

## 2014-09-17 NOTE — Progress Notes (Signed)
Interpreter - Amanda PeaAlvi Almonte present for encounter.  Pt reports decreased FM x 8 days - felt good FM during NST. Labor sx reviewed. IOL scheduled 3/29 @ 1930

## 2014-09-18 NOTE — Progress Notes (Signed)
NST reviewed and reactive.  

## 2014-09-21 ENCOUNTER — Ambulatory Visit (INDEPENDENT_AMBULATORY_CARE_PROVIDER_SITE_OTHER): Payer: Medicaid Other | Admitting: *Deleted

## 2014-09-21 VITALS — BP 122/71 | HR 59

## 2014-09-21 DIAGNOSIS — O480 Post-term pregnancy: Secondary | ICD-10-CM | POA: Diagnosis not present

## 2014-09-21 NOTE — Progress Notes (Signed)
Interpreter - Kim Blair present for encounter today

## 2014-09-22 ENCOUNTER — Encounter (HOSPITAL_COMMUNITY): Payer: Self-pay

## 2014-09-22 ENCOUNTER — Inpatient Hospital Stay (HOSPITAL_COMMUNITY)
Admission: RE | Admit: 2014-09-22 | Discharge: 2014-09-26 | DRG: 987 | Disposition: A | Discharge status: Home / Self Care | Payer: Medicaid Other | Source: Ambulatory Visit | Attending: Obstetrics and Gynecology | Admitting: Obstetrics and Gynecology

## 2014-09-22 DIAGNOSIS — E282 Polycystic ovarian syndrome: Secondary | ICD-10-CM | POA: Diagnosis present

## 2014-09-22 DIAGNOSIS — Z6836 Body mass index (BMI) 36.0-36.9, adult: Secondary | ICD-10-CM

## 2014-09-22 DIAGNOSIS — E669 Obesity, unspecified: Secondary | ICD-10-CM | POA: Diagnosis present

## 2014-09-22 DIAGNOSIS — Z3A41 41 weeks gestation of pregnancy: Secondary | ICD-10-CM | POA: Diagnosis present

## 2014-09-22 DIAGNOSIS — O480 Post-term pregnancy: Secondary | ICD-10-CM | POA: Diagnosis present

## 2014-09-22 DIAGNOSIS — O411230 Chorioamnionitis, third trimester, not applicable or unspecified: Secondary | ICD-10-CM | POA: Diagnosis present

## 2014-09-22 DIAGNOSIS — O99214 Obesity complicating childbirth: Secondary | ICD-10-CM | POA: Diagnosis present

## 2014-09-22 LAB — CBC
HCT: 34.5 % — ABNORMAL LOW (ref 36.0–46.0)
Hemoglobin: 11.7 g/dL — ABNORMAL LOW (ref 12.0–15.0)
MCH: 29.8 pg (ref 26.0–34.0)
MCHC: 33.9 g/dL (ref 30.0–36.0)
MCV: 88 fL (ref 78.0–100.0)
PLATELETS: 191 10*3/uL (ref 150–400)
RBC: 3.92 MIL/uL (ref 3.87–5.11)
RDW: 13.6 % (ref 11.5–15.5)
WBC: 8.4 10*3/uL (ref 4.0–10.5)

## 2014-09-22 LAB — TYPE AND SCREEN
ABO/RH(D): O POS
Antibody Screen: NEGATIVE

## 2014-09-22 MED ORDER — OXYTOCIN 40 UNITS IN LACTATED RINGERS INFUSION - SIMPLE MED
62.5000 mL/h | INTRAVENOUS | Status: DC
  Administered 2014-09-24: 62.5 mL/h via INTRAVENOUS

## 2014-09-22 MED ORDER — ACETAMINOPHEN 325 MG PO TABS
650.0000 mg | ORAL_TABLET | ORAL | Status: DC | PRN
  Administered 2014-09-24: 650 mg via ORAL
  Filled 2014-09-22: qty 2

## 2014-09-22 MED ORDER — OXYTOCIN BOLUS FROM INFUSION: 500.0000 mL | INTRAVENOUS | Status: DC

## 2014-09-22 MED ORDER — OXYCODONE-ACETAMINOPHEN 5-325 MG PO TABS: 1.0000 | ORAL_TABLET | ORAL | Status: DC | PRN

## 2014-09-22 MED ORDER — CITRIC ACID-SODIUM CITRATE 334-500 MG/5ML PO SOLN: 30.0000 mL | ORAL | Status: DC | PRN

## 2014-09-22 MED ORDER — ONDANSETRON HCL 4 MG/2ML IJ SOLN: 4.0000 mg | Freq: Four times a day (QID) | INTRAMUSCULAR | Status: DC | PRN

## 2014-09-22 MED ORDER — OXYCODONE-ACETAMINOPHEN 5-325 MG PO TABS: 2.0000 | ORAL_TABLET | ORAL | Status: DC | PRN

## 2014-09-22 MED ORDER — MISOPROSTOL 25 MCG QUARTER TABLET
25.0000 ug | ORAL_TABLET | ORAL | Status: DC | PRN
  Administered 2014-09-22 – 2014-09-23 (×4): 25 ug via ORAL
  Filled 2014-09-22: qty 1
  Filled 2014-09-22 (×4): qty 0.25

## 2014-09-22 MED ORDER — LACTATED RINGERS IV SOLN
500.0000 mL | INTRAVENOUS | Status: DC | PRN
  Administered 2014-09-24: 500 mL via INTRAVENOUS

## 2014-09-22 MED ORDER — TERBUTALINE SULFATE 1 MG/ML IJ SOLN: 0.2500 mg | Freq: Once | INTRAMUSCULAR | Status: AC | PRN

## 2014-09-22 MED ORDER — LACTATED RINGERS IV SOLN
INTRAVENOUS | Status: DC
  Administered 2014-09-22 – 2014-09-24 (×5): via INTRAVENOUS

## 2014-09-22 MED ORDER — LIDOCAINE HCL (PF) 1 % IJ SOLN
30.0000 mL | INTRAMUSCULAR | Status: DC | PRN
  Filled 2014-09-22: qty 30

## 2014-09-22 NOTE — Plan of Care (Signed)
Problem: Consults Goal: Birthing Suites Patient Information Press F2 to bring up selections list Outcome: Completed/Met Date Met:  09/22/14  Pt > [redacted] weeks EGA and Non-English Speaking

## 2014-09-22 NOTE — H&P (Signed)
LABOR ADMISSION HISTORY AND PHYSICAL  Kim Blair is a 24 y.o. female G1P0 with IUP at [redacted]w[redacted]d by  presenting for 20 week [place]. She reports +FMs, No LOF, no VB, no blurry vision, headaches or peripheral edema, and RUQ pain.  She plans on breast and bottle feeding. She is undecided on method of birth control at discharge. Estimated to be at 65% in weight at 28 week [place]. Denies any problems with pregnancy so far.   Prenatal History/Complications:  Past Medical History: Past Medical History  Diagnosis Date  . PCOS (polycystic ovarian syndrome)   . Medical history non-contributory     Past Surgical History: History reviewed. No pertinent past surgical history.  Obstetrical History: OB History    Gravida Para Term Preterm AB TAB SAB Ectopic Multiple Living   1               Social History: History   Social History  . Marital Status: Single    Spouse Name: N/A  . Number of Children: N/A  . Years of Education: N/A   Social History Main Topics  . Smoking status: Never Smoker   . Smokeless tobacco: Never Used  . Alcohol Use: No  . Drug Use: No  . Sexual Activity: Not Currently    Birth Control/ Protection: None   Other Topics Concern  . None   Social History Narrative    Family History: History reviewed. No pertinent family history.  Allergies: No Known Allergies  Prescriptions prior to admission  Medication Sig Dispense Refill Last Dose  . Prenat w/o A Vit-FeFum-FePo-FA (CONCEPT OB) 130-92.4-1 MG CAPS Take 1 capsule by mouth daily. 30 capsule 9 09/22/2014 at 1000     Review of Systems   All systems reviewed and negative except as stated in HPI  Height 5' 1.02" (1.55 m), weight 88.451 kg (195 lb), last menstrual period 11/29/2013, unknown if currently breastfeeding. General appearance: alert, cooperative and no distress Lungs: clear to auscultation bilaterally, no wheezing noted Heart: regular rate and rhythm, no murmurs noted Abdomen: soft, non-tender;  bowel sounds normal Extremities: Homans sign is negative, no sign of DVT Presentation: cephalic Fetal monitoringBaseline: 140 bpm, Variability: Good {> 6 bpm), Accelerations: Reactive and Decelerations: Absent Uterine activity: Irregular    Dilation: Fingertip Effacement (%): Thick Cervical Position: Posterior Station: -3 Presentation: Vertex Exam by:: Miachel Roux RN  Prenatal labs: ABO, Rh: O/POS/-- (11/04 1412) Antibody: NEG (11/04 1412) Rubella:   Immune RPR: NON REAC (12/23 1000)  HBsAg: NEGATIVE (11/04 1412)  HIV: NONREACTIVE (12/23 1000)  GBS: Negative (02/26 0000)  1 hr Glucola 84, 117 Genetic screening Normal Anatomy US Normal  [facility] Prenatal Labs  Dating BEGA [place] 05/01/14 Blood type: O/POS/-- (11/04 1412)  Genetic Screen 1 Screen:                 AFP:                    Quad:                  NIPS: Antibody:NEG (11/04 1412)  Anatomic [place] Normal female with incomplete anatomy. F/U normal. Rubella: 2.42 (11/04 1412)  GTT Early: 84     Third trimester: 117 RPR: NON REAC (12/23 1000)   TDaP vaccine 06/17/14 HBsAg: NEGATIVE (11/04 1412)   Flu vaccine 04/29/14 HIV: NONREACTIVE (12/23 1000)   GBS Neg GBS: Neg  Contraception undecided Pap: Neg  Baby Food Breast GC/Chlam negative  Circumcision n/a  Pediatrician  [facility]   Support Person  Husband, sister in law    No results found for this or any previous visit (from the past 24 hour(s)).  Patient Active Problem List   Diagnosis Date Noted  . Pregnancy, post-term 09/22/2014  . Evaluate anatomy not seen on prior sonogram   . Encounter for routine screening for malformation using ultrasonics   . Supervision of normal pregnancy in second trimester 04/29/2014    Assessment: Kim Blair is a 24 y.o. G1P0 at 10268w0d here for IOL for post-dates.  #Labor: Induction of Labor. Oral Cytotec. Will attempt Foley Bulb placement. Will initiate Pitocin once cervix is favorable. #Pain: Percocet PRN.  Fentanyl  PRN. Epidural available at patient request. #FWB: Category 1 #ID:  GBS negative #MOF: Breast and Bottle #MOC: Undecided. Will counsel post partum #Circ:  N/A  [name], [place] N 09/22/2014, 8:19 PM   OB fellow attestation:  I have seen and examined this patient; I agree with above documentation in the resident's note.   Kim Blair is a 24 y.o. G1P0 here for IOL 2/2 prolonged pregnancy  PE: BP 126/77 mmHg  Pulse 74  Temp(Src) 98.8 F (37.1 C) (Oral)  Resp 16  Ht 5' 1.02" (1.55 m)  Wt 195 lb (88.451 kg)  BMI 36.82 kg/m2  LMP 11/29/2013 (Exact Date) Gen: calm comfortable, NAD Resp: normal effort, no distress Abd: gravid  ROS, labs, PMH reviewed  Plan: MOF: breast and bottle MOC: undecided ID: GNS neg FWB: cat I Labor: cytotec, attempted FB unsuccessfully   Kim Blair Kim Blair 09/22/2014, 11:31 PM

## 2014-09-22 NOTE — Progress Notes (Signed)
Foley bulb placement attempted by Drs. Rumley and [place]. Placement unsuccessful at this time. Will proceed with an oral dose of cytotec, and will reattempt placement at a later time.

## 2014-09-22 NOTE — Progress Notes (Signed)
I assisted Engineer, manufacturingCarmen RN with questions and Dr Caroleen Hammanumley Resident, by Orlan LeavensViria Alvarez , Spanish Interpreter

## 2014-09-23 ENCOUNTER — Encounter (HOSPITAL_COMMUNITY): Payer: Self-pay

## 2014-09-23 LAB — HIV ANTIBODY (ROUTINE TESTING W REFLEX): HIV Screen 4th Generation wRfx: NONREACTIVE

## 2014-09-23 LAB — ABO/RH: ABO/RH(D): O POS

## 2014-09-23 MED ORDER — FENTANYL 2.5 MCG/ML BUPIVACAINE 1/10 % EPIDURAL INFUSION (WH - ANES)
14.0000 mL/h | INTRAMUSCULAR | Status: DC | PRN
  Administered 2014-09-24: 14 mL/h via EPIDURAL
  Filled 2014-09-23: qty 125

## 2014-09-23 MED ORDER — FENTANYL CITRATE 0.05 MG/ML IJ SOLN
100.0000 ug | INTRAMUSCULAR | Status: DC | PRN
  Administered 2014-09-23: 100 ug via INTRAVENOUS
  Filled 2014-09-23: qty 2

## 2014-09-23 MED ORDER — TERBUTALINE SULFATE 1 MG/ML IJ SOLN: 0.2500 mg | Freq: Once | INTRAMUSCULAR | Status: AC | PRN

## 2014-09-23 MED ORDER — DIPHENHYDRAMINE HCL 50 MG/ML IJ SOLN: 12.5000 mg | INTRAMUSCULAR | Status: DC | PRN

## 2014-09-23 MED ORDER — EPHEDRINE 5 MG/ML INJ
10.0000 mg | INTRAVENOUS | Status: DC | PRN
  Filled 2014-09-23: qty 2

## 2014-09-23 MED ORDER — PHENYLEPHRINE 40 MCG/ML (10ML) SYRINGE FOR IV PUSH (FOR BLOOD PRESSURE SUPPORT)
80.0000 ug | PREFILLED_SYRINGE | INTRAVENOUS | Status: DC | PRN
  Filled 2014-09-23: qty 20
  Filled 2014-09-23: qty 2

## 2014-09-23 MED ORDER — PHENYLEPHRINE 40 MCG/ML (10ML) SYRINGE FOR IV PUSH (FOR BLOOD PRESSURE SUPPORT)
80.0000 ug | PREFILLED_SYRINGE | INTRAVENOUS | Status: DC | PRN
Start: 2014-09-23 — End: 2014-09-24
  Filled 2014-09-23: qty 2

## 2014-09-23 MED ORDER — LACTATED RINGERS IV SOLN
500.0000 mL | Freq: Once | INTRAVENOUS | Status: AC
  Administered 2014-09-24: 500 mL via INTRAVENOUS

## 2014-09-23 MED ORDER — OXYTOCIN 40 UNITS IN LACTATED RINGERS INFUSION - SIMPLE MED
1.0000 m[IU]/min | INTRAVENOUS | Status: DC
  Administered 2014-09-23: 2 m[IU]/min via INTRAVENOUS
  Filled 2014-09-23: qty 1000

## 2014-09-23 NOTE — Progress Notes (Signed)
Kim Blair is a 24 y.o. G1P0 at 6456w1d by LMP admitted for induction of labor due to Post dates. Due date 09/15/2014   Subjective: Slightly uncomfortable with contractions. Doing well. No complaints  Objective: BP 108/64 mmHg  Pulse 76  Temp(Src) 98.7 F (37.1 C) (Oral)  Resp 18  Ht 5' 1.02" (1.55 m)  Wt 88.451 kg (195 lb)  BMI 36.82 kg/m2  LMP 11/29/2013 (Exact Date)      FHT:  FHR: 140 bpm, variability: moderate,  accelerations:  Present,  decelerations:  Absent UC:   regular, every 5 minutes . Category 1 SVE:   Dilation: 5.5 Effacement (%): 80 Station: -2 Exam by:: Kim Blair, CNM  Labs: Lab Results  Component Value Date   WBC 8.4 09/22/2014   HGB 11.7* 09/22/2014   HCT 34.5* 09/22/2014   MCV 88.0 09/22/2014   PLT 191 09/22/2014    Assessment / Plan: Induction of labor due to postterm,  progressing well on pitocin  Labor: Progressing normally; Posterior cervix; Attempted AROM.Unable to see any fluid . Will continue to observe for color of fluid Preeclampsia:  labs stable and n/a Fetal Wellbeing:  Category I Pain Control:  Labor support without medications I/D:  n/a Anticipated MOD:  NSVD  Kim Blair 09/23/2014, 5:35 PM

## 2014-09-23 NOTE — Progress Notes (Signed)
Kim Blair is a 24 y.o. G1P0 at 7274w1d admitted for induction of labor due to Post dates.  Subjective: Pt is slightly uncomfortable as she is having mild contractions.  Pt is currently sitting on a birthing ball.   Objective: BP 129/75 mmHg  Pulse 74  Temp(Src) 98.4 F (36.9 C) (Oral)  Resp 16  Ht 5' 1.02" (1.55 m)  Wt 88.451 kg (195 lb)  BMI 36.82 kg/m2  LMP 11/29/2013 (Exact Date)     FHT:  FHR: 145 bpm, variability: moderate,  accelerations:  Present,  decelerations:  Absent UC:   Irregular  Last SVE:   Dilation: 1 Effacement (%): 50 Station: -2 Exam by:: [place] CNM  Labs: Lab Results  Component Value Date   WBC 8.4 09/22/2014   HGB 11.7* 09/22/2014   HCT 34.5* 09/22/2014   MCV 88.0 09/22/2014   PLT 191 09/22/2014    Assessment / Plan: Induction of labor due to postterm,  Foley bulb still intact, progressing well on Cytotec   Labor: Foley bulb, cytotec Preeclampsia:  labs stable Fetal Wellbeing:  Category I Pain Control:  Labor support without medications I/D:  GBS negative Anticipated MOD:  NSVD  Kim Blair 09/23/2014, 1:24 PM

## 2014-09-23 NOTE — Progress Notes (Addendum)
Dr Emelda FearFerguson notified regarding blood pressures. Pt complaining of mild headache and states she has seen spots but has been since pain med was given. Reflexes 2+, no clonus. Will continue to monitor per dr Emelda Fearferguson. No orders received.

## 2014-09-23 NOTE — Progress Notes (Signed)
Kim Blair is a 24 y.o. G1P0 at 4375w1d   Subjective: Comfortable without pain medications, but starting to feel contractions in pelvis.   Objective: BP 121/70 mmHg  Pulse 69  Temp(Src) 98.5 F (36.9 C) (Oral)  Resp 20  Ht 5' 1.02" (1.55 m)  Wt 88.451 kg (195 lb)  BMI 36.82 kg/m2  LMP 11/29/2013 (Exact Date)      FHT:  FHR: 140 bpm, variability: moderate,  accelerations:  Present,  decelerations:  Absent UC:   Irregular SVE:   Dilation: 1 Effacement (%): Thick Station: -3 Exam by:: Caroleen Hammanumley, MD  Labs: Lab Results  Component Value Date   WBC 8.4 09/22/2014   HGB 11.7* 09/22/2014   HCT 34.5* 09/22/2014   MCV 88.0 09/22/2014   PLT 191 09/22/2014    Assessment / Plan: Induction of Labor due to Post Dates  Labor: Progressing normally. Continue cytotec. Will place Foley Bulb if indicated at next exam. Will initiate Pitocin when cervix favorable. Preeclampsia:  no signs or symptoms of toxicity Fetal Wellbeing:  Category I Pain Control:  Labor support without medications I/D:  n/a Anticipated MOD:  NSVD  [name], [place] N 09/23/2014, 2:33 AM

## 2014-09-23 NOTE — Progress Notes (Addendum)
Kim Blair is a 24 y.o. G1P0 at 6372w1d   Subjective: Feels contractions. Does not wish for pain medication at this time. Offered to contact interpreter to help with visit, but patient refused, stating she understood what was said throughout visit. Will continue to offer interpretation services at every encounter throughout hospitalization.  Objective: BP 139/78 mmHg  Pulse 67  Temp(Src) 98.5 F (36.9 C) (Oral)  Resp 16  Ht 5' 1.02" (1.55 m)  Wt 88.451 kg (195 lb)  BMI 36.82 kg/m2  LMP 11/29/2013 (Exact Date)      FHT:  FHR: 140 bpm, variability: moderate,  accelerations:  Present,  decelerations:  Absent UC:   Irregular SVE:   Dilation: 1 Effacement (%): Thick Station: -3 Exam by:: S Grindstaff RN  Labs: Lab Results  Component Value Date   WBC 8.4 09/22/2014   HGB 11.7* 09/22/2014   HCT 34.5* 09/22/2014   MCV 88.0 09/22/2014   PLT 191 09/22/2014    Assessment / Plan: Induction of Labor for Post dates  Labor: Continue Cytotec. Consider Foley Bulb, however may be difficult based on cervical exam. Initiate Pitocin once cervix favorable. Preeclampsia:  no signs or symptoms of toxicity Fetal Wellbeing:  Category I Pain Control:  Labor support without medications I/D:  n/a Anticipated MOD:  NSVD  [name], [place] N 09/23/2014, 7:58 AM

## 2014-09-23 NOTE — Progress Notes (Signed)
Kim Blair is a 24 y.o. G1P0 at 713w1d   Subjective: Notes some pain but does not want additional pain medication at this time. Membranes ruptured.  Objective: BP 148/82 mmHg  Pulse 70  Temp(Src) 98.6 F (37 C) (Oral)  Resp 18  Ht 5' 1.02" (1.55 m)  Wt 88.451 kg (195 lb)  BMI 36.82 kg/m2  LMP 11/29/2013 (Exact Date)      FHT:  FHR: 140 bpm, variability: moderate,  accelerations:  Present,  decelerations:  Absent UC:   Irregular, but appears to transitioning to regular contractions SVE:   Dilation: 5 Effacement (%): 80 Station: -2 Exam by:: Dr Kim Blair  Labs: Lab Results  Component Value Date   WBC 8.4 09/22/2014   HGB 11.7* 09/22/2014   HCT 34.5* 09/22/2014   MCV 88.0 09/22/2014   PLT 191 09/22/2014    Assessment / Plan: Induction of labor due to postterm,  progressing well on pitocin  Labor: Progressing normally. Continue Pitocin. AROM. S/p foley bulb and cytotec Preeclampsia:  no signs or symptoms of toxicity Fetal Wellbeing:  Category I Pain Control:  Labor support without medications I/D:  n/a Anticipated MOD:  NSVD  Kim Blair, [place] N 09/23/2014, 8:41 PM

## 2014-09-23 NOTE — Progress Notes (Signed)
LABOR PROGRESS NOTE  Kim Blair is a 24 y.o. G1P0 at 2019w1d  admitted for postdate induction of labor  Subjective: She is comfortable , having mild contractions. No complaints, Interpreter was called in during foley bulb placement  Objective: BP 126/82 mmHg  Pulse 62  Temp(Src) 98.5 F (36.9 C) (Oral)  Resp 16  Ht 5' 1.02" (1.55 m)  Wt 88.451 kg (195 lb)  BMI 36.82 kg/m2  LMP 11/29/2013 (Exact Date) or  Filed Vitals:   09/23/14 0630 09/23/14 0745 09/23/14 0902 09/23/14 0945  BP: 138/89 139/78 113/85 126/82  Pulse: 72 67 71 62  Temp:  98.5 F (36.9 C)    TempSrc:  Oral    Resp:  16 20 16   Height:      Weight:        Time 930am Dilation: 1 Effacement (%): 50 Cervical Position: Posterior Station: -2 Presentation: Vertex Exam by:: Inari Shin CNM   Uc's irregular , mild  Labs: Lab Results  Component Value Date   WBC 8.4 09/22/2014   HGB 11.7* 09/22/2014   HCT 34.5* 09/22/2014   MCV 88.0 09/22/2014   PLT 191 09/22/2014    Patient Active Problem List   Diagnosis Date Noted  . Pregnancy, post-term 09/22/2014  . Evaluate anatomy not seen on prior sonogram   . Encounter for routine screening for malformation using ultrasonics   . Supervision of normal pregnancy in second trimester 04/29/2014    Assessment / Plan: 24 y.o. G1P0 at 5219w1d here for postdate induction of labor  Foley Bulb placed with out difficulty Labor: cytotec; Foley Bulb Induction Fetal Wellbeing:  FHT's 150 baseline Category 1 Pain Control:   Clear Liquid Diet Anticipated MOD:  SVD  Kylle Lall Grissett 09/23/2014, 9:59 AM

## 2014-09-24 ENCOUNTER — Inpatient Hospital Stay (HOSPITAL_COMMUNITY): Payer: Medicaid Other | Admitting: Anesthesiology

## 2014-09-24 ENCOUNTER — Encounter (HOSPITAL_COMMUNITY): Payer: Self-pay

## 2014-09-24 DIAGNOSIS — Z3A41 41 weeks gestation of pregnancy: Secondary | ICD-10-CM

## 2014-09-24 LAB — RPR: RPR Ser Ql: NONREACTIVE

## 2014-09-24 MED ORDER — PRENATAL MULTIVITAMIN CH
1.0000 | ORAL_TABLET | Freq: Every day | ORAL | Status: DC
  Administered 2014-09-25 – 2014-09-26 (×2): 1 via ORAL
  Filled 2014-09-24 (×2): qty 1

## 2014-09-24 MED ORDER — LIDOCAINE HCL (PF) 1 % IJ SOLN
INTRAMUSCULAR | Status: DC | PRN
Start: 2014-09-24 — End: 2014-09-26
  Administered 2014-09-24: 4 mL
  Administered 2014-09-24: 3 mL

## 2014-09-24 MED ORDER — BENZOCAINE-MENTHOL 20-0.5 % EX AERO
1.0000 "application " | INHALATION_SPRAY | CUTANEOUS | Status: DC | PRN
  Administered 2014-09-24 – 2014-09-26 (×2): 1 via TOPICAL
  Filled 2014-09-24 (×2): qty 56

## 2014-09-24 MED ORDER — ONDANSETRON HCL 4 MG/2ML IJ SOLN: 4.0000 mg | INTRAMUSCULAR | Status: DC | PRN

## 2014-09-24 MED ORDER — TETANUS-DIPHTH-ACELL PERTUSSIS 5-2.5-18.5 LF-MCG/0.5 IM SUSP: 0.5000 mL | Freq: Once | INTRAMUSCULAR | Status: DC

## 2014-09-24 MED ORDER — SENNOSIDES-DOCUSATE SODIUM 8.6-50 MG PO TABS
2.0000 | ORAL_TABLET | ORAL | Status: DC
  Administered 2014-09-25 – 2014-09-26 (×2): 2 via ORAL
  Filled 2014-09-24 (×2): qty 2

## 2014-09-24 MED ORDER — SIMETHICONE 80 MG PO CHEW: 80.0000 mg | CHEWABLE_TABLET | ORAL | Status: DC | PRN

## 2014-09-24 MED ORDER — ONDANSETRON HCL 4 MG PO TABS: 4.0000 mg | ORAL_TABLET | ORAL | Status: DC | PRN

## 2014-09-24 MED ORDER — LANOLIN HYDROUS EX OINT: TOPICAL_OINTMENT | CUTANEOUS | Status: DC | PRN

## 2014-09-24 MED ORDER — SODIUM CHLORIDE 0.9 % IV SOLN
2.0000 g | Freq: Four times a day (QID) | INTRAVENOUS | Status: DC
  Administered 2014-09-24: 2 g via INTRAVENOUS
  Filled 2014-09-24 (×2): qty 2000

## 2014-09-24 MED ORDER — DIPHENHYDRAMINE HCL 25 MG PO CAPS: 25.0000 mg | ORAL_CAPSULE | Freq: Four times a day (QID) | ORAL | Status: DC | PRN

## 2014-09-24 MED ORDER — MISOPROSTOL 200 MCG PO TABS
800.0000 ug | ORAL_TABLET | Freq: Once | ORAL | Status: AC
Start: 2014-09-24 — End: 2014-09-24
  Administered 2014-09-24: 800 ug via ORAL

## 2014-09-24 MED ORDER — DIBUCAINE 1 % RE OINT: 1.0000 "application " | TOPICAL_OINTMENT | RECTAL | Status: DC | PRN

## 2014-09-24 MED ORDER — OXYCODONE-ACETAMINOPHEN 5-325 MG PO TABS
1.0000 | ORAL_TABLET | ORAL | Status: DC | PRN
  Administered 2014-09-26: 1 via ORAL
  Filled 2014-09-24: qty 1

## 2014-09-24 MED ORDER — IBUPROFEN 600 MG PO TABS
600.0000 mg | ORAL_TABLET | Freq: Four times a day (QID) | ORAL | Status: DC
  Administered 2014-09-24 – 2014-09-26 (×10): 600 mg via ORAL
  Filled 2014-09-24 (×10): qty 1

## 2014-09-24 MED ORDER — ZOLPIDEM TARTRATE 5 MG PO TABS: 5.0000 mg | ORAL_TABLET | Freq: Every evening | ORAL | Status: DC | PRN

## 2014-09-24 MED ORDER — MISOPROSTOL 200 MCG PO TABS
ORAL_TABLET | ORAL | Status: AC
Start: 2014-09-24 — End: 2014-09-24
  Filled 2014-09-24: qty 4

## 2014-09-24 MED ORDER — DEXTROSE 5 % IV SOLN
180.0000 mg | Freq: Three times a day (TID) | INTRAVENOUS | Status: DC
  Filled 2014-09-24 (×2): qty 4.5

## 2014-09-24 MED ORDER — FENTANYL 2.5 MCG/ML BUPIVACAINE 1/10 % EPIDURAL INFUSION (WH - ANES)
INTRAMUSCULAR | Status: DC | PRN
  Administered 2014-09-24: 14 mL/h via EPIDURAL

## 2014-09-24 MED ORDER — ACETAMINOPHEN 325 MG PO TABS
650.0000 mg | ORAL_TABLET | ORAL | Status: DC | PRN
  Administered 2014-09-24: 650 mg via ORAL
  Filled 2014-09-24: qty 2

## 2014-09-24 MED ORDER — WITCH HAZEL-GLYCERIN EX PADS: 1.0000 "application " | MEDICATED_PAD | CUTANEOUS | Status: DC | PRN

## 2014-09-24 NOTE — Progress Notes (Signed)
Notified by RN that temp 101.5 and FHR 170's x 30 min.  Dr. Emelda FearFerguson notified.  To room to assess pushing effort.

## 2014-09-24 NOTE — Progress Notes (Signed)
Kim Blair is a 24 y.o. G1P0 at 1166w2d  Subjective: Comfortable with epidural.  Objective: BP 108/54 mmHg  Pulse 95  Temp(Src) 98.8 F (37.1 C) (Oral)  Resp 16  Ht 5' 1.02" (1.55 m)  Wt 88.451 kg (195 lb)  BMI 36.82 kg/m2  SpO2 98%  LMP 11/29/2013 (Exact Date)      FHT:  FHR: 155 bpm, variability: moderate,  accelerations:  Present,  decelerations:  Present Variabilities improved with position change UC:   Regular SVE:   Dilation: 7 Effacement (%): 80 Station: 0 Exam by:: Dr. Caroleen Hammanumley  Labs: Lab Results  Component Value Date   WBC 8.4 09/22/2014   HGB 11.7* 09/22/2014   HCT 34.5* 09/22/2014   MCV 88.0 09/22/2014   PLT 191 09/22/2014    Assessment / Plan: Induction of labor due to postterm,  progressing well on pitocin  Labor: Progressing normally. Continue titrating Pitocin. IUPC in place. S/P Cytotec and Foley Bulb. Preeclampsia:  no signs or symptoms of toxicity Fetal Wellbeing:  Category I Pain Control:  Epidural I/D:  n/a Anticipated MOD:  NSVD  [name], [place] N 09/24/2014, 3:57 AM

## 2014-09-24 NOTE — Progress Notes (Signed)
Interpreter in to go over plan  Of care and answer questions.

## 2014-09-24 NOTE — Progress Notes (Signed)
Delivery of live viable female by Dr Emelda FearFerguson and Ilean SkillW. Muhummad, CNM. NICU, RROBRN at bedside.

## 2014-09-24 NOTE — Progress Notes (Signed)
   Subjective: Kim Blair is a 24 y.o. G1P0 at 2269w2d for IOL for postdates.  Reports increase pain with contractions.  Declines epidural or additional pain meds at this time.        Objective: BP 139/76 mmHg  Pulse 97  Temp(Src) 99.1 F (37.3 C) (Oral)  Resp 18  Ht 5' 1.02" (1.55 m)  Wt 88.451 kg (195 lb)  BMI 36.82 kg/m2  SpO2 98%  LMP 11/29/2013 (Exact Date)    FHT:  150's, accelerations, no decelerations UC:   q 3-4 minutes SVE:   Dilation: 5.5 Effacement (%): 50 Station: -3 Exam by:: W.Karim, CNM  IUPC placed without difficulty  Labs: Lab Results  Component Value Date   WBC 8.4 09/22/2014   HGB 11.7* 09/22/2014   HCT 34.5* 09/22/2014   MCV 88.0 09/22/2014   PLT 191 09/22/2014    Assessment / Plan: 24 y.o. G1P0 at 7669w2d for IOL for postdates Category I FHR      Labor: IUPC placed Preeclampsia:  n/a Fetal Wellbeing:  Category I Pain Control:  Fentanyl I/D:  GBS neg Anticipated MOD:  NSVD  KARIM, Aleks Nawrot N 09/24/2014, 12:13 AM

## 2014-09-24 NOTE — Anesthesia Procedure Notes (Signed)
Epidural Patient location during procedure: OB Start time: 09/24/2014 2:23 AM  Staffing Anesthesiologist: Mal AmabileFOSTER, Melesio Madara Performed by: anesthesiologist   Preanesthetic Checklist Completed: patient identified, site marked, surgical consent, pre-op evaluation, timeout performed, IV checked, risks and benefits discussed and monitors and equipment checked  Epidural Patient position: sitting Prep: site prepped and draped and DuraPrep Patient monitoring: continuous pulse ox and blood pressure Approach: midline Location: L3-L4 Injection technique: LOR air  Needle:  Needle type: Tuohy  Needle gauge: 17 G Needle length: 9 cm and 9 Needle insertion depth: 5 cm cm Catheter type: closed end flexible Catheter size: 19 Gauge Catheter at skin depth: 10 cm Test dose: negative and Other  Assessment Events: blood not aspirated, injection not painful, no injection resistance, negative IV test and no paresthesia  Additional Notes Patient identified. Risks and benefits discussed including failed block, incomplete  Pain control, post dural puncture headache, nerve damage, paralysis, blood pressure Changes, nausea, vomiting, reactions to medications-both toxic and allergic and post Partum back pain. All questions were answered. Patient expressed understanding and wished to proceed. Sterile technique was used throughout procedure. Epidural site was Dressed with sterile barrier dressing. No paresthesias, signs of intravascular injection Or signs of intrathecal spread were encountered.  Patient was more comfortable after the epidural was dosed. Please see RN's note for documentation of vital signs and FHR which are stable.

## 2014-09-24 NOTE — Progress Notes (Signed)
I assisted Natalie,RN with questions. Eda H Royal  Interpreter.

## 2014-09-24 NOTE — Progress Notes (Signed)
I stopped to check on patients need, I ordered her dinner, snack and breakfast, also I assisted Sports administratoratalie RN with some questions, by Orlan LeavensViria Alvarez Spanish Interpreter

## 2014-09-24 NOTE — Progress Notes (Signed)
I ordered patient's lunch.  Kim Blair  Interpreter. °

## 2014-09-24 NOTE — Progress Notes (Signed)
Interpreter in to do admission paper work and plan of  Care explained. Questions answered.

## 2014-09-24 NOTE — Anesthesia Preprocedure Evaluation (Addendum)
Anesthesia Evaluation  Patient identified by MRN, date of birth, ID band Patient awake    Reviewed: Allergy & Precautions, Patient's Chart, lab work & pertinent test results  Airway Mallampati: III  TM Distance: >3 FB Neck ROM: Full    Dental no notable dental hx. (+) Teeth Intact   Pulmonary neg pulmonary ROS,  breath sounds clear to auscultation  Pulmonary exam normal       Cardiovascular negative cardio ROS  Rhythm:Regular Rate:Normal     Neuro/Psych negative neurological ROS  negative psych ROS   GI/Hepatic negative GI ROS, Neg liver ROS,   Endo/Other  Obesity PCOS  Renal/GU negative Renal ROS  negative genitourinary   Musculoskeletal negative musculoskeletal ROS (+)   Abdominal (+) + obese,   Peds  Hematology  (+) anemia ,   Anesthesia Other Findings   Reproductive/Obstetrics (+) Pregnancy                            Anesthesia Physical Anesthesia Plan  ASA: II  Anesthesia Plan: Epidural   Post-op Pain Management:    Induction:   Airway Management Planned: Natural Airway  Additional Equipment:   Intra-op Plan:   Post-operative Plan:   Informed Consent: I have reviewed the patients History and Physical, chart, labs and discussed the procedure including the risks, benefits and alternatives for the proposed anesthesia with the patient or authorized representative who has indicated his/her understanding and acceptance.     Plan Discussed with: Anesthesiologist  Anesthesia Plan Comments:        Anesthesia Quick Evaluation

## 2014-09-24 NOTE — Progress Notes (Signed)
Good fetal descent with pushing.  Dr. Emelda FearFerguson in room.  Plan:  Vacuum extraction.

## 2014-09-24 NOTE — Lactation Note (Signed)
This note was copied from the chart of Kim Blair. Lactation Consultation Note  Patient Name: Kim Blair WUJWJ'XToday's Date: 09/24/2014 Reason for consult: Initial assessment of this mom and baby at 13 hours pp.  This mom speaks Spanish and FOB speaks some [place].  LC able to speak Spanish and demonstrates hand expression technique.  Baby is well-latched to mom's (L) breast but due to her soreness of perineum, she is choosing to breastfeed baby standing up.  Baby is on side on bedside table and is well-latched to mom's (L) breast with rhythmical sucking bursts and swallows.  Mom has requested a more comfortable chair which she will try breastfeeding in when available.  LC informed her nurse and receptionist.  LC discussed and encouraged frequent STS and cue feedings.  Mom has Spanish Baby and Me book.  LC encouraged review of Baby and Me pp 9, 14 and 20-25 for STS and BF information.LC provided [employer] Resource brochure in Spanish, and reviewed WH services and list of community and web site resources, especially [facility] website which has information available in [place]..   Maternal Data Formula Feeding for Exclusion: Yes Reason for exclusion: Mother's choice to formula and breast feed on admission Has patient been taught Hand Expression?: Yes (LC demonstrated technique and reviewed verbally in Spanish) Does the patient have breastfeeding experience prior to this delivery?: No  Feeding Feeding Type: Breast Fed  LATCH Score/Interventions Latch: Grasps breast easily, tongue down, lips flanged, rhythmical sucking.  Audible Swallowing: Spontaneous and intermittent  Type of Nipple: Everted at rest and after stimulation  Comfort (Breast/Nipple): Soft / non-tender     Hold (Positioning): No assistance needed to correctly position infant at breast. Intervention(s): Breastfeeding basics reviewed;Skin to skin  LATCH Score: 10 (LC observed and assessed feeding in  progress)  Lactation Tools Discussed/Used   STS, cue feedings, hand expression  Consult Status Consult Status: Follow-up Date: 09/25/14 Follow-up type: In-patient    Warrick ParisianBryant, Melora Menon [facility] 09/24/2014, 9:11 PM

## 2014-09-24 NOTE — Anesthesia Postprocedure Evaluation (Signed)
Anesthesia Post Note  Patient: Kim Blair  Procedure(s) Performed: * No procedures listed *  Anesthesia type: Epidural  Patient location: Mother/Baby  Post pain: Pain level controlled  Post assessment: Post-op Vital signs reviewed  Last Vitals:  Filed Vitals:   09/24/14 1200  BP:   Pulse:   Temp: 37.4 C  Resp:     Post vital signs: Reviewed  Level of consciousness:alert  Complications: No apparent anesthesia complications

## 2014-09-24 NOTE — Progress Notes (Addendum)
ANTIBIOTIC CONSULT NOTE - INITIAL  Pharmacy Consult for Gentamicin Indication: Chorioamnionitis   No Known Allergies  Patient Measurements: Height: 5' 1.02" (155 cm) Weight: 195 lb (88.451 kg) IBW/kg (Calculated) : 47.86 Adjusted Body Weight: 60.1kg  Vital Signs: Temp: 99.2 F (37.3 C) (03/31 0501) Temp Source: Oral (03/31 0256) BP: 145/79 mmHg (03/31 0701) Pulse Rate: 105 (03/31 0701)  Labs:  Recent Labs  09/22/14 2034  WBC 8.4  HGB 11.7*  PLT 191   No results for input(s): GENTTROUGH, GENTPEAK, GENTRANDOM in the last 72 hours.   Microbiology: Recent Results (from the past 720 hour(s))  Culture, OB Urine     Status: None   Collection Time: 08/28/14  9:46 AM  Result Value Ref Range Status   Colony Count 25,000 COLONIES/ML  Final   Organism ID, Bacteria Multiple bacterial morphotypes present, none  Final   Organism ID, Bacteria predominant. Suggest appropriate recollection if   Final   Organism ID, Bacteria clinically indicated.  Final  Culture, OB Urine     Status: None   Collection Time: 09/15/14  9:59 AM  Result Value Ref Range Status   Colony Count NO GROWTH  Final   Organism ID, Bacteria NO GROWTH  Final    Medications:  Ampicillin 2 Grams IV Q6H  Assessment: 24 y.o. female G1P0 at 4069w2d  Estimated Ke = 0.388, Vd = 0.4 L/Kg  Goal of Therapy:  Gentamicin peak 6-8 mg/L and Trough < 1 mg/L  Plan:  Gentamicin 180 mg IV every 8 hrs  Check Scr with next labs if gentamicin continued. Will check gentamicin levels if continued > 72hr or clinically indicated.  Javarius Tsosie N 09/24/2014,8:07 AM

## 2014-09-24 NOTE — Progress Notes (Signed)
Kim Blair is a 24 y.o. G1P0 at 1374w2d   Subjective: Continues to note mild pain with contractions and mild headache, but states pain is well controlled with pain medication and she does not want any additional treatment at this time.  Objective: BP 139/76 mmHg  Pulse 97  Temp(Src) 99.1 F (37.3 C) (Oral)  Resp 18  Ht 5' 1.02" (1.55 m)  Wt 88.451 kg (195 lb)  BMI 36.82 kg/m2  SpO2 98%  LMP 11/29/2013 (Exact Date)      FHT:  FHR: 160 bpm, variability: moderate,  accelerations:  Present,  decelerations:  Absent UC:   Regular SVE:   Dilation: 5.5 Effacement (%): 50 Station: -3 Exam by:: W.Karim, CNM  Labs: Lab Results  Component Value Date   WBC 8.4 09/22/2014   HGB 11.7* 09/22/2014   HCT 34.5* 09/22/2014   MCV 88.0 09/22/2014   PLT 191 09/22/2014    Assessment / Plan: Induction of labor due to postterm,  progressing well on pitocin  Labor: Progressing normally. Continue Pitocin. IUPC placed. Preeclampsia:  no signs or symptoms of toxicity Fetal Wellbeing:  Category I Pain Control:  Fentanyl I/D:  n/a Anticipated MOD:  NSVD  Kim Blair, [place] N 09/24/2014, 12:26 AM

## 2014-09-24 NOTE — Progress Notes (Signed)
Notified that patient with elevated temperature despite motrin/tylenol.    VAVD @0747  this morning.  No abdominal tenderness, some perineal discomfort.  Otherwise reports feeling very well and no complaints.  Tolerating PO, no cough.   F (37.4 C)  --  --  --  --  --  --  --  -- NB     09/24/14 1100  100 F (37.8 C)   105  --  18  124/75 mmHg  --  97 %  Room Air -- NB   09/24/14 1000  100.1 F (37.8 C)  90  --  18  121/67 mmHg  --  98 %  Room Air -- NB   09/24/14 0933  --  95  --  18  133/72 mmHg  --  --  -- -- LM   09/24/14 0916   101.2 F (38.4 C)                    Lajune Perine ROCIO, MD

## 2014-09-24 NOTE — Progress Notes (Signed)
I was present during the delivery with Dr. Emelda FearFerguson.  Kim Blair  Interpreter.

## 2014-09-25 LAB — CBC
HEMATOCRIT: 26.6 % — AB (ref 36.0–46.0)
Hemoglobin: 8.9 g/dL — ABNORMAL LOW (ref 12.0–15.0)
MCH: 29.6 pg (ref 26.0–34.0)
MCHC: 33.5 g/dL (ref 30.0–36.0)
MCV: 88.4 fL (ref 78.0–100.0)
Platelets: 159 10*3/uL (ref 150–400)
RBC: 3.01 MIL/uL — ABNORMAL LOW (ref 3.87–5.11)
RDW: 14.2 % (ref 11.5–15.5)
WBC: 15.2 10*3/uL — ABNORMAL HIGH (ref 4.0–10.5)

## 2014-09-25 NOTE — Progress Notes (Signed)
Patient ID: Kim Blair, female   DOB: 06-Mar-1991, 24 y.o.   MRN: [id number] Post Partum Day 1 Subjective:  Kim Blair is a 24 y.o. G1P1001 611w2d s/p vacuum assisted delivery @ [phone]. No acute events overnight. Fever yesterday morning, with Temp 101.2 at 0900, received 1 dose of ampicillin. Feels well now. Pt denies fever, problems with ambulating, voiding or po intake. She endorses some dizziness. She denies nausea or vomiting.  Pain is well controlled on ibuprofen.  She has not had flatus. She has had bowel movement.  Lochia Minimal.  Plan for birth control is undecided.  Method of Feeding: breast with bottle to supplement.  Objective: Blood pressure 96/50, pulse 72, temperature 98.3 F (36.8 C), temperature source Oral, resp. rate 18, height 5' 1.02" (1.55 m), weight 88.451 kg (195 lb), last menstrual period 11/29/2013, SpO2 98 %, currently breastfeeding.  Physical Exam:  General: alert, cooperative and no distress Lochia:normal flow Chest: CTAB Heart: RRR no m/r/g Abdomen: +BS, soft, nontender Uterine Fundus: firm DVT Evaluation: No evidence of DVT seen on physical exam. Negative Homan's sign. Extremities: no edema   Recent Labs  09/22/14 2034 09/25/14 0540  HGB 11.7* 8.9*  HCT 34.5* 26.6*   Filed Vitals:   09/24/14 1200 09/24/14 1543 09/25/14 0050 09/25/14 0525  BP:  118/70 98/51 96/50   Pulse:  98 80 72  Temp: 99.3 F (37.4 C) 98.3 F (36.8 C) 98.9 F (37.2 C) 98.3 F (36.8 C)  TempSrc: Oral Oral Oral Oral  Resp:  18 20 18   Height:      Weight:      SpO2:   98%     Assessment/Plan:  ASSESSMENT: Kim Blair is a 24 y.o. G1P1001 661w2d s/p vacuum assisted delivery and chorioamnionitis yesterday treated with ampicillin. Vitals stable. Patient is feeling well. No complaints.   Plan for discharge tomorrow, Breastfeeding and Contraception undecided.   LOS: 3 days   Jeralyn Ruthsicholas A Deborahann Poteat 09/25/2014, 7:31 AM

## 2014-09-25 NOTE — Lactation Note (Signed)
This note was copied from the chart of Kim Blair. Lactation Consultation Note Follow up visit at 34 hours of age.  Baby is on double photo therapy.  Baby has breastfed 7 times and bottle of 5610mlsx5, with 1 void 1 stool.  Mom is complaining of nipple pain and shallow latch.  Discussed waiting for wide open mouth for latch.  Interpreter used for visit.  Mom has easily expressed colostrum from right nipple and bruising noted on left.  Mom reports increased leaking early today, but no colostrum on left breast now.  Hand pump given with instructions on use and cleaning, mom has WIC.  Will consider DEBP when available.  Per chart mom has PCOS.  Baby asleep in crib with photo lights on.      Patient Name: Kim Blair ZOXWR'UToday's Date: 09/25/2014 Reason for consult: Follow-up assessment   Maternal Data    Feeding Feeding Type: Breast Fed Length of feed: 30 min  LATCH Score/Interventions                      Lactation Tools Discussed/Used Initiated by:: LC Date initiated:: 09/25/14   Consult Status Consult Status: Follow-up Date: 09/26/14 Follow-up type: In-patient    Shoptaw, Arvella MerlesJana Lynn 09/25/2014, 6:15 PM

## 2014-09-25 NOTE — Progress Notes (Signed)
UR chart review completed.  

## 2014-09-26 MED ORDER — IBUPROFEN 600 MG PO TABS: 600.0000 mg | ORAL_TABLET | Freq: Four times a day (QID) | ORAL | Status: DC

## 2014-09-26 NOTE — Discharge Instructions (Signed)

## 2014-09-26 NOTE — Discharge Summary (Signed)
Obstetric Discharge Summary Reason for Admission: induction of labor for postdates Prenatal Procedures: none Intrapartum Procedures: vacuum Postpartum Procedures: none and antibiotics (single dose Amp 2gm) Complications-Operative and Postpartum: partial 3rd degree perineal laceration HEMOGLOBIN  Date Value Ref Range Status  09/25/2014 8.9* 12.0 - 15.0 g/dL Final   HCT  Date Value Ref Range Status  09/25/2014 26.6* 36.0 - 46.0 % Final   Kim Blair is a 24yo G1 admitted at 41.0wks in the evening of 3/29 for postdates IOL. The usual ripening methods were used including cytotec, foley bulb and finally Pitocin. Pt progressed to complete and pushing by the morning of 3/31. Prior to delivery she had an elevated temp w/ fetal tachycardia. It was decided that vacuum would be helpful in expediting delivery. Afterwards she rec'd a dose of Amp 2gm and had an additional elevated temp approx 2 hrs after delivery, but then remained afebrile during her PP stay. By PPD#2 she is doing well and is deemed to have received the full benefit of her hospital stay and will be discharged home. She is breast and bottlefeeding and declines contraception at this time.  Physical Exam:  General: alert, cooperative and no distress  Heart: RRR Lungs: nl effort Lochia: appropriate Uterine Fundus: firm DVT Evaluation: No evidence of DVT seen on physical exam.  Discharge Diagnoses: Term Pregnancy-delivered and Amnionitis  Discharge Information: Date: 09/26/2014 Activity: pelvic rest Diet: routine Medications: PNV and Ibuprofen Condition: stable Instructions: refer to practice specific booklet Discharge to: home Follow-up Information    [facility] Schedule an appointment as soon as possible for a visit in 4 weeks.   Why:  For your postpartum appointment.   Contact information:   [address] [place] [id number] [phone]      Newborn Data: Live born  female  Birth Weight: 7 lb 8 oz (3402 g) APGAR: 8, 9  Home with mother.  Kim Blair, [name] 09/26/2014, 7:45 AM

## 2014-09-26 NOTE — Progress Notes (Signed)
Assisted RN with interpretation of discharge instructions.  Also assisted lactation with interpretation of breastfeeding instructions. Spanish Interpreter

## 2014-09-26 NOTE — Lactation Note (Addendum)
This note was copied from the chart of Kim Blair. Lactation Consultation Note  Patient Name: Kim Blair ZOXWR'UToday's Date: 09/26/2014 Reason for consult: Follow-up assessment;Other (Comment) (same consult ) ( Kim Blair ( Spanish interpreter present for consult interpreting )  Baby is 6157 hours old and was on double photo and it was D/C after this afternoons Serum Bilirubin which was 7.7, up from 7.6 this am.. Baby and mom are going home this afternoon , per mom mom nipples are tender , especially the left . LC assessed breast tissue and noted the left to be larger  Than the right and also noted positional strips on both, areola edema on the left and small crack on the outer edge of the left . Per mom to sore to feed on the left . LC reviewed sore nipple prevention and tx , prior to latch , breast massage, hand express, prepump with hand pump if needed to make the nipple and areola complex  more elastic for a deeper latch. LC walked mom through the steps , areola compressible enough so pre-pumping wasn't necessary. Baby rooting , and LC assisted  Mom to latch in the football position, initially per mom felt discomfort, but eased up with breast compressions , multiply swallows noted and increased with breast compressions LC reviewed basics using the Baby and me booklet and showing pictures. Baby fed for 20 mins and LC showed mom how to release suction. Nipple appeared  Normal when baby  Released. Baby sound asleep and relaxed state. Per mom feeding felt so much better . LC instructed mom on the shells , comfort gels, and reviewed the hand pump in the sense of flanges sizes . LC recommended increasing the flange size to #27 on the left until soreness improved and to use EBM , or olive oil to decrease on the friction of dry against dry.  LC also recommended pumping on the left side until the soreness improves. Reviewed engorgement prevention and tx , referring to the Baby and  me booklet pages 24 - 25. Mother informed of post-discharge support and given phone number to the lactation department, including services for phone call assistance; out-patient appointments; and breastfeeding support group. List of other breastfeeding resources in the community given in the handout. Encouraged mother to call for problems or concerns related to breastfeeding. LC also recommended calling WIC for assistance if needed ( [facility]    Maternal Data Has patient been taught Hand Expression?: Yes  Feeding Feeding Type: Breast Fed Length of feed: 20 min (right breast )  LATCH Score/Interventions Latch: Grasps breast easily, tongue down, lips flanged, rhythmical sucking.  Audible Swallowing: Spontaneous and intermittent  Type of Nipple: Everted at rest and after stimulation  Comfort (Breast/Nipple): Filling, red/small blisters or bruises, mild/mod discomfort  Problem noted: Mild/Moderate discomfort  Hold (Positioning): Assistance needed to correctly position infant at breast and maintain latch. (worked on depth and positioning ) Intervention(s): Breastfeeding basics reviewed;Support Pillows;Position options;Skin to skin  LATCH Score: 8  Lactation Tools Discussed/Used Tools: Shells;Pump;Comfort gels Flange Size: 27 Shell Type: Inverted Breast pump type: Manual WIC Program: Yes   Consult Status Consult Status: Complete Date: 09/26/14 Follow-up type: In-patient    Kim Greathouseorio, Kim Blair 09/26/2014, 4:54 PM

## 2014-10-09 NOTE — Progress Notes (Signed)
NST reactive.

## 2014-10-14 ENCOUNTER — Encounter: Payer: Self-pay | Admitting: Obstetrics & Gynecology

## 2014-11-02 ENCOUNTER — Ambulatory Visit (INDEPENDENT_AMBULATORY_CARE_PROVIDER_SITE_OTHER): Payer: Self-pay | Admitting: Family Medicine

## 2014-11-02 ENCOUNTER — Encounter: Payer: Self-pay | Admitting: Family Medicine

## 2014-11-02 NOTE — Patient Instructions (Signed)
Etonogestrel implant Qu es este medicamento? El ETONOGESTREL es un dispositivo anticonceptivo (control de la natalidad). Se utiliza para prevenir el embarazo. Se puede utilizar hasta 3 aos. Este medicamento puede ser utilizado para otros usos; si tiene alguna pregunta consulte con su proveedor de atencin mdica o con su farmacutico. MARCAS COMERCIALES DISPONIBLES: Implanon, Nexplanon Qu le debo informar a mi profesional de la salud antes de tomar este medicamento? Necesita saber si usted presenta alguno de los siguientes problemas o situaciones: -sangrado vaginal anormal -enfermedad vascular o cogulos sanguneos -cncer de mama, cervical, heptico -depresin -diabetes -enfermedad de la vescula biliar -dolores de cabeza -enfermedad cardiaca o ataque cardiaco reciente -alta presin sangunea -alto nivel de colesterol -enfermedad renal -enfermedad heptica -convulsiones -fuma tabaco -una reaccin alrgica o inusual al etonogestrel, otras hormonas, anestsicos o antispticos, medicamentos, alimentos, colorantes o conservantes -si est embarazada o buscando quedar embarazada -si est amamantando a un beb Cmo debo utilizar este medicamento? Este dispositivo se inserta debajo de la piel en la cara interna de la parte superior del brazo por un profesional de la salud. Hable con su pediatra para informarse acerca del uso de este medicamento en nios. Puede requerir atencin especial. Sobredosis: Pngase en contacto inmediatamente con un centro toxicolgico o una sala de urgencia si usted cree que haya tomado demasiado medicamento. ATENCIN: Este medicamento es solo para usted. No comparta este medicamento con nadie. Qu sucede si me olvido de una dosis? No se aplica en este caso. Qu puede interactuar con este medicamento? No tome esta medicina con ninguno de los siguientes medicamentos: -amprenavir -bosentano -fosamprenavir Esta medicina tambin puede interactuar con los  siguientes medicamentos: -medicamentos barbitricos para inducir el sueo o tratar convulsiones -ciertos medicamentos para las infecciones micticas tales como quetoconazol e itraconazol -griseofulvina -medicamentos para tratar convulsiones, tales como carbamazepina, felbamato, oxcarbazepina, fenitona, topiramato -modafinil -fenilbutazona -rifampicina -algunos medicamentos para tratar la infeccin por VIH tales como atazanavir, indinavir, lopinavir, nelfinavir, tipranavir, ritonavir -hierba de [place] Puede ser que esta lista no menciona todas las posibles interacciones. Informe a su profesional de la salud de todos los productos a base de hierbas, medicamentos de venta libre o suplementos nutritivos que est tomando. Si usted fuma, consume bebidas alcohlicas o si utiliza drogas ilegales, indqueselo tambin a su profesional de la salud. Algunas sustancias pueden interactuar con su medicamento. A qu debo estar atento al usar este medicamento? Este medicamento no protege contra la infeccin por el VIH (SIDA) o otras enfermedades de transmisin sexual. Usted debe sentir el implante al presionar con las yemas de los dedos sobre la piel donde se insert. Dgale a su mdico si no se siente el implante. Qu efectos secundarios puedo tener al utilizar este medicamento? Efectos secundarios que debe informar a su mdico o a su profesional de la salud tan pronto como sea posible: -reacciones alrgicas como erupcin cutnea, picazn o urticarias, hinchazn de la cara, labios o lengua -ndulos mamarios -cambios en la visin -confusin, dificultad para hablar o entender -orina de color oscura -humor deprimido -sensacin general de estar enfermo o sntomas gripales -heces claras -prdida del apetito, nuseas -dolor en la regin abdominal superior derecha -dolores de cabeza severos -dolor, hinchazon o sensibilidad grave en el abdomen -falta de aliento, dolor en el pecho, hinchazn de la  pierna -seales de un embarazo -entumecimiento o debilidad repentina de la cara, brazo o pierna -dificultad para caminar, mareos, prdida de equilibrio o coordinacin -sangrado o flujo vaginal inusual -cansancio o debilidad inusual -color amarillento de los ojos o la piel   Efectos secundarios que, por lo general, no requieren atencin mdica (debe informarlos a su mdico o a su profesional de la salud si persisten o si son molestos): -acn -dolor de pecho -cambios de peso -tos -fiebre o escalofros -dolor de cabeza -sangrado menstrual irregular -picazn, ardo o flujo vaginal -dolor o dificultad para orinar -dolor de garganta Puede ser que esta lista no menciona todos los posibles efectos secundarios. Comunquese a su mdico por asesoramiento mdico sobre los efectos secundarios. Usted puede informar los efectos secundarios a la FDA por telfono al 1-800-FDA-1088. Dnde debo guardar mi medicina? Este medicamento se administra en hospitales o clnicas y no necesitar guardarlo en su domicilio. ATENCIN: Este folleto es un resumen. Puede ser que no cubra toda la posible informacin. Si usted tiene preguntas acerca de esta medicina, consulte con su mdico, su farmacutico o su profesional de la salud.  2015, Elsevier/Gold Standard. (2012-01-18 18:26:08)  

## 2014-11-02 NOTE — Progress Notes (Signed)
Ana used for interpreter 

## 2014-11-02 NOTE — Progress Notes (Signed)
Patient ID: Kim Blair, female   DOB: 03-Mar-1991, 24 y.o.   MRN: [id number] Subjective:     Kim Blair is a 24 y.o. female who presents for a postpartum visit. She is 5 weeks postpartum following a spontaneous vaginal delivery. I have fully reviewed the prenatal and intrapartum course. The delivery was at 2938w2d gestational weeks. Outcome: vacuum assisted vaginal delivery. Anesthesia: epidural. Postpartum course has been normal. Baby's course has been unremarkable. Baby is feeding by breast. Bleeding no bleeding. Bowel function is normal. Bladder function is normal. Patient is not sexually active. Contraception method is undecided at the moment; patient will discuss with husband. Postpartum depression screening: negative.  The following portions of the patient's history were reviewed and updated as appropriate: allergies, current medications, past family history, past medical history, past social history, past surgical history and problem list.  Review of Systems Pertinent items are noted in HPI.   Objective:    There were no vitals taken for this visit.  General:  alert, cooperative and no distress     Lungs: clear to auscultation bilaterally and normal percussion bilaterally  Heart:  regular rate and rhythm, S1, S2 normal, no murmur, click, rub or gallop  Abdomen: soft, non-tender; bowel sounds normal; no masses,  no organomegaly   Vulva:  normal  Vagina: normal vagina, no discharge, exudate, lesion, or erythema  Cervix:  no lesions  Corpus: not examined  Adnexa:  not evaluated  Rectal Exam: Not performed.        Assessment:     normal postpartum exam. Pap smear not done at today's visit.   Plan:    1. Contraception: condoms 2. Follow up in: 1 year or as needed.

## 2015-01-25 ENCOUNTER — Emergency Department (HOSPITAL_COMMUNITY)
Admission: EM | Admit: 2015-01-25 | Discharge: 2015-01-25 | Disposition: A | Discharge status: Home / Self Care | Payer: Self-pay | Attending: Emergency Medicine | Admitting: Emergency Medicine

## 2015-01-25 ENCOUNTER — Encounter (HOSPITAL_COMMUNITY): Payer: Self-pay | Admitting: *Deleted

## 2015-01-25 DIAGNOSIS — O9229 Other disorders of breast associated with pregnancy and the puerperium: Secondary | ICD-10-CM

## 2015-01-25 DIAGNOSIS — Y999 Unspecified external cause status: Secondary | ICD-10-CM | POA: Insufficient documentation

## 2015-01-25 DIAGNOSIS — Y939 Activity, unspecified: Secondary | ICD-10-CM | POA: Insufficient documentation

## 2015-01-25 DIAGNOSIS — S6991XA Unspecified injury of right wrist, hand and finger(s), initial encounter: Secondary | ICD-10-CM

## 2015-01-25 DIAGNOSIS — Y929 Unspecified place or not applicable: Secondary | ICD-10-CM | POA: Insufficient documentation

## 2015-01-25 DIAGNOSIS — M654 Radial styloid tenosynovitis [de Quervain]: Secondary | ICD-10-CM

## 2015-01-25 DIAGNOSIS — S60031A Contusion of right middle finger without damage to nail, initial encounter: Secondary | ICD-10-CM | POA: Insufficient documentation

## 2015-01-25 DIAGNOSIS — X58XXXA Exposure to other specified factors, initial encounter: Secondary | ICD-10-CM | POA: Insufficient documentation

## 2015-01-25 DIAGNOSIS — Z8639 Personal history of other endocrine, nutritional and metabolic disease: Secondary | ICD-10-CM | POA: Insufficient documentation

## 2015-01-25 MED ORDER — IBUPROFEN 800 MG PO TABS: 800.0000 mg | ORAL_TABLET | Freq: Three times a day (TID) | ORAL | Status: DC

## 2015-01-25 MED ORDER — IBUPROFEN 400 MG PO TABS
800.0000 mg | ORAL_TABLET | Freq: Once | ORAL | Status: AC
  Administered 2015-01-25: 800 mg via ORAL
  Filled 2015-01-25: qty 2

## 2015-01-25 MED ORDER — LIDOCAINE-EPINEPHRINE 1 %-1:100000 IJ SOLN
10.0000 mL | Freq: Once | INTRAMUSCULAR | Status: AC
  Administered 2015-01-25: 10 mL
  Filled 2015-01-25: qty 1

## 2015-01-25 MED ORDER — CEPHALEXIN 500 MG PO CAPS: 500.0000 mg | ORAL_CAPSULE | Freq: Four times a day (QID) | ORAL | Status: DC

## 2015-01-25 MED ORDER — LIDOCAINE HCL (PF) 1 % IJ SOLN
30.0000 mL | Freq: Once | INTRAMUSCULAR | Status: AC
  Administered 2015-01-25: 30 mL
  Filled 2015-01-25: qty 30

## 2015-01-25 NOTE — ED Notes (Addendum)
Pt states that she started experiencing a lump in her rt hand middle finger several months ago. States that yesterday it popped spontaneously and blood came out. Pt also c/o left wrist pain x 2 months. Denies injury.

## 2015-01-25 NOTE — Discharge Instructions (Signed)

## 2015-01-25 NOTE — ED Provider Notes (Signed)
CSN: [id number]     Arrival date & time 01/25/15  1935 History  This chart was scribed for Eber Hong, MD by Budd Palmer, ED Scribe. This patient was seen in room TR06C/TR06C and the patient's care was started at 8:03 PM.    Chief Complaint  Patient presents with  . Finger Injury   The history is provided by the patient. A language interpreter was used.   HPI Comments: Kim Blair is a 24 y.o. female who presents to the Emergency Department complaining of a lump/blister on the right middle finger that appeared 2.5 months after giving birth, which ruptured and began to bleed 1 day ago. She notes associated pain to the finger. She denies any previous injuries.  Pt also reports constant left-sided wrist pain onset 2 months ago after delivering her baby. She notes exacerbation of the pain with movement of her thumb. Pt denies associated swelling.  Pt also c/o a painful nodule on the right nipple. Pt notes increased swelling of the tip of the nipple. She denies purulent discharge.  Past Medical History  Diagnosis Date  . PCOS (polycystic ovarian syndrome)   . Medical history non-contributory    History reviewed. No pertinent past surgical history. No family history on file. History  Substance Use Topics  . Smoking status: Never Smoker   . Smokeless tobacco: Never Used  . Alcohol Use: No   OB History    Gravida Para Term Preterm AB TAB SAB Ectopic Multiple Living   0 1     Review of Systems  Musculoskeletal: Positive for arthralgias.  Skin: Positive for wound.    Allergies  Review of patient's allergies indicates no known allergies.  Home Medications   Prior to Admission medications   Medication Sig Start Date End Date Taking? Authorizing Provider  cephALEXin (KEFLEX) 500 MG capsule Take 1 capsule (500 mg total) by mouth 4 (four) times daily. 01/25/15   Eber Hong, MD  ibuprofen (ADVIL,MOTRIN) 800 MG tablet Take 1 tablet (800 mg total) by mouth 3  (three) times daily. 01/25/15   Eber Hong, MD  Prenat w/o A Vit-FeFum-FePo-FA (CONCEPT OB) 130-92.4-1 MG CAPS Take 1 capsule by mouth daily. 04/29/14   Hillery Hunter Melancon, MD   BP 141/82 mmHg  Pulse 64  Temp(Src) 98.4 F (36.9 C) (Oral)  Resp 14  SpO2 100% Physical Exam  Constitutional: She appears well-developed and well-nourished.  HENT:  Head: Normocephalic and atraumatic.  Eyes: Conjunctivae are normal. Right eye exhibits no discharge. Left eye exhibits no discharge.  Pulmonary/Chest: Effort normal. No respiratory distress.  No asymmetry of the breasts or nipples. TTP of the right nipple. No purulent discharge. Female chaperone present for exam.  Musculoskeletal:  Tiny draining hematoma in the volar crease of the 3rd MCP of the right hand. L hand positive Finkelstein test.  Neurological: She is alert. Coordination normal.  Skin: Skin is warm and dry. No rash noted. She is not diaphoretic. No erythema.  Psychiatric: She has a normal mood and affect.  Nursing note and vitals reviewed.   ED Course  Procedures  DIAGNOSTIC STUDIES: Oxygen Saturation is 100% on RA, normal by my interpretation.    COORDINATION OF CARE: 8:08 PM - Discussed possible tendonitis. Discussed plans to get a translator. Pt advised of plan for treatment and pt agrees.  8:20 PM - Re-visited pt with interpreter to obtain a clearer HPI. Chaperone present for breast exam. Discussed possible nipple infection or inflammation.  Will order antibiotics. Advised pt to feed from the left nipple for the next 2 weeks. Discussed plans to place 1 suture on the open wound on the base of the right finger.Will give 1x dose of ibuprofen for pain. Clarified Dx of tendonitis and will order a brace to be worn for about 1 month. Pt advised of plan for treatment and patient agrees.  9:00 PM - Single suture placed to the wound at the 3rd MCP of the right hand. LACERATION REPAIR PROCEDURE NOTE The patient's identification was confirmed  and consent was obtained. This procedure was performed by Eber Hong, MD at 9:28 PM. Site: R finger 3rd, volar surface, proximal Sterile procedures observed Yes Anesthetic used (type and amt): Lidocaine with epi - 1 cc Suture type/size: 5-0 prolene Length: bleeding puncture site # of Sutures: 1 Technique: Horizontal mattress Complexity Simple Antibx ointment applied yes Tetanus UTD or ordered - No Site anesthetized, irrigated with NS, explored without evidence of foreign body, wound well approximated, site covered with dry, sterile dressing.  Patient tolerated procedure well without complications. Instructions for care discussed verbally and patient provided with additional written instructions for homecare and f/u.   Labs Review Labs Reviewed - No data to display  Imaging Review No results found.    MDM   Final diagnoses:  De Quervain's tenosynovitis, left  Finger injury, right, initial encounter  Postpartum nipple pain    Bleeding appears to be a piece of granulation type tissue - pt encouraged to f/u - no bleeding after stitich placed - had De Quarvain's tenonsynovitis - brace and nsaids presribed, nipple may have some early mastitits - pt stable for d/c.  Meds given in ED:  Medications  ibuprofen (ADVIL,MOTRIN) tablet 800 mg (800 mg Oral Given 01/25/15 2032)  lidocaine (PF) (XYLOCAINE) 1 % injection 30 mL (30 mLs Other Given 01/25/15 2032)  lidocaine-EPINEPHrine (XYLOCAINE W/EPI) 1 %-1:100000 (with pres) injection 10 mL (10 mLs Other Given 01/25/15 2035)    New Prescriptions   CEPHALEXIN (KEFLEX) 500 MG CAPSULE    Take 1 capsule (500 mg total) by mouth 4 (four) times daily.   IBUPROFEN (ADVIL,MOTRIN) 800 MG TABLET    Take 1 tablet (800 mg total) by mouth 3 (three) times daily.      I personally performed the services described in this documentation, which was scribed in my presence. The recorded information has been reviewed and considered.   Eber Hong,  MD 01/25/15 2128

## 2015-01-25 NOTE — ED Notes (Signed)
Interpreter service used. 

## 2015-01-25 NOTE — ED Notes (Signed)
Interpreter phone used to assess pt.  Pt c/o pain and tenderness to right nipple. Bleeding to right index finger and pain to left wrist.

## 2015-02-14 IMAGING — US US OB FOLLOW-UP
1 series · 12 of 28 positions shown · non-contrast
Comparison: none

[Series 1: us ob follow up · 56 acquisitions, 12 frames shown]
[im 3/56]
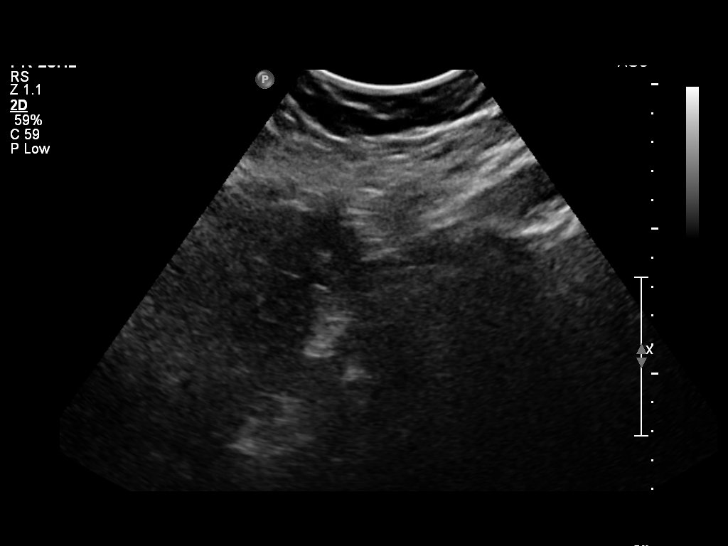
[im 7/56]
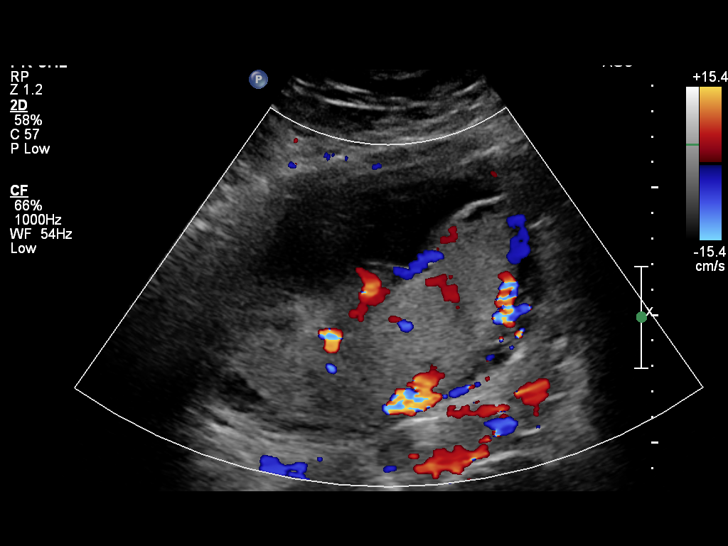
[im 11/56]
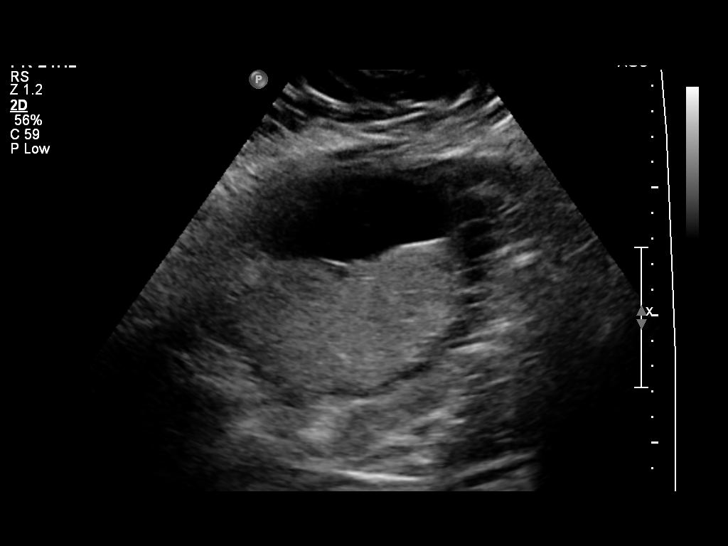
[im 17/56]
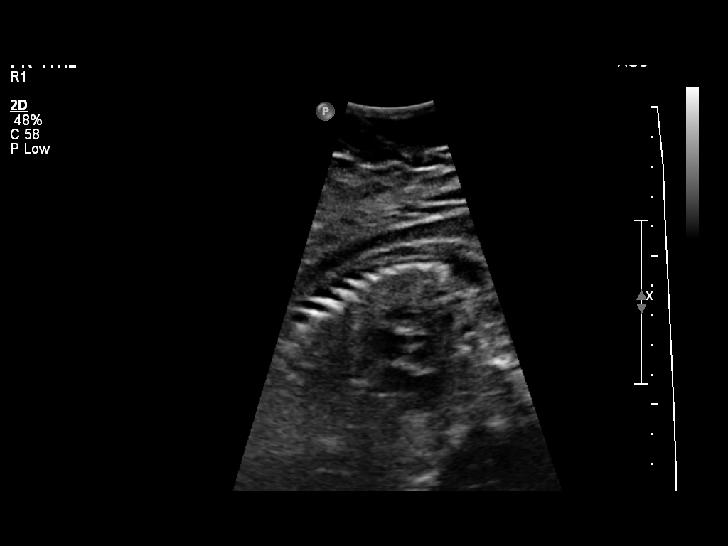
[im 21/56]
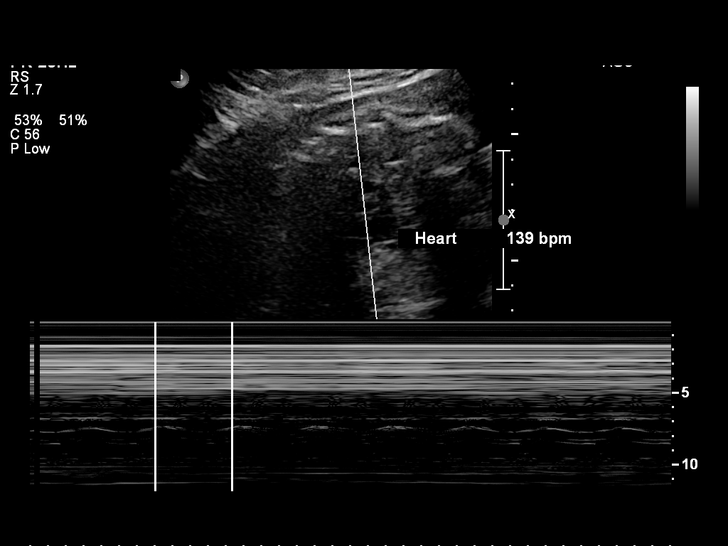
[im 25/56]
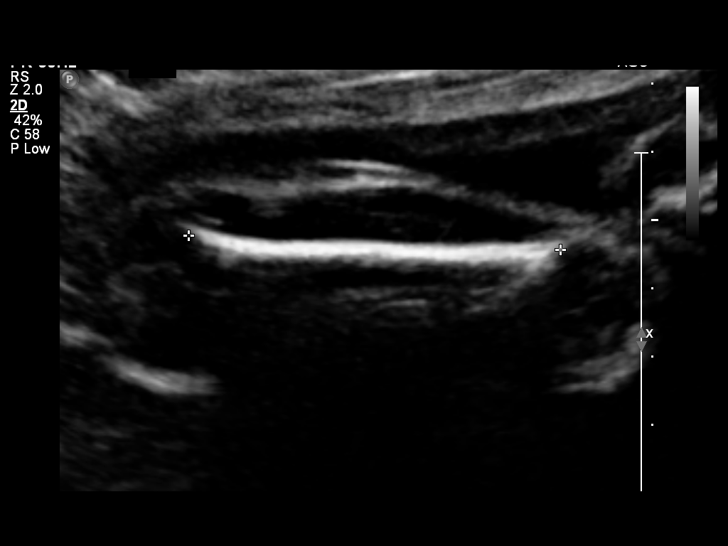
[im 31/56]
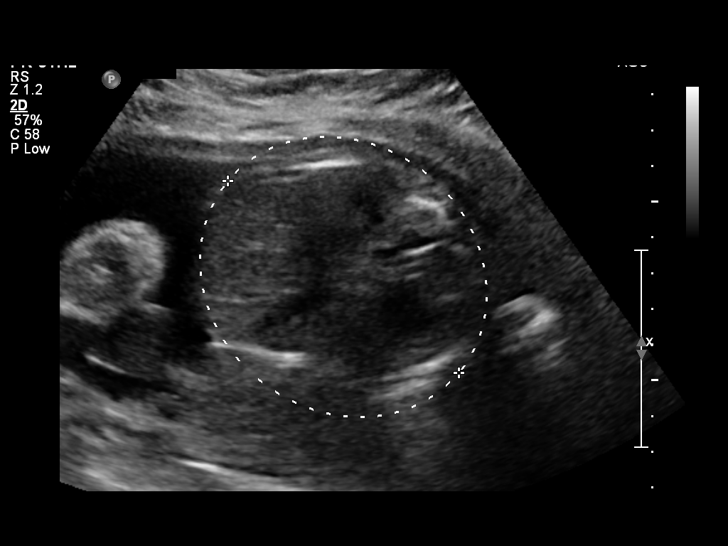
[im 35/56]
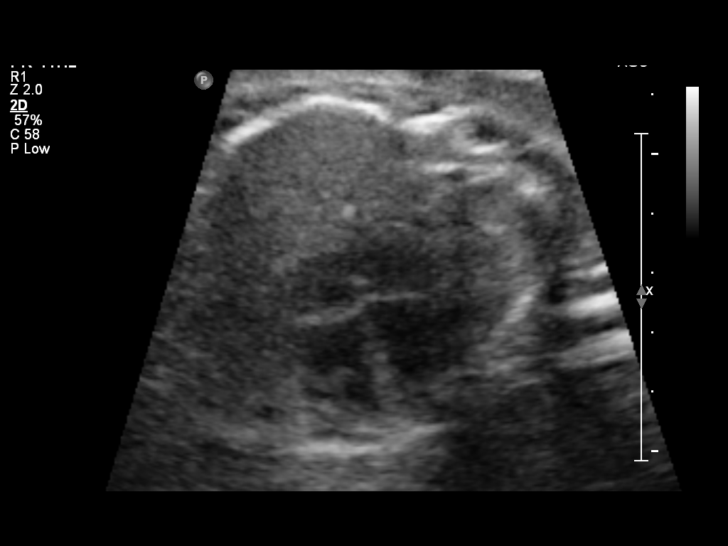
[im 39/56]
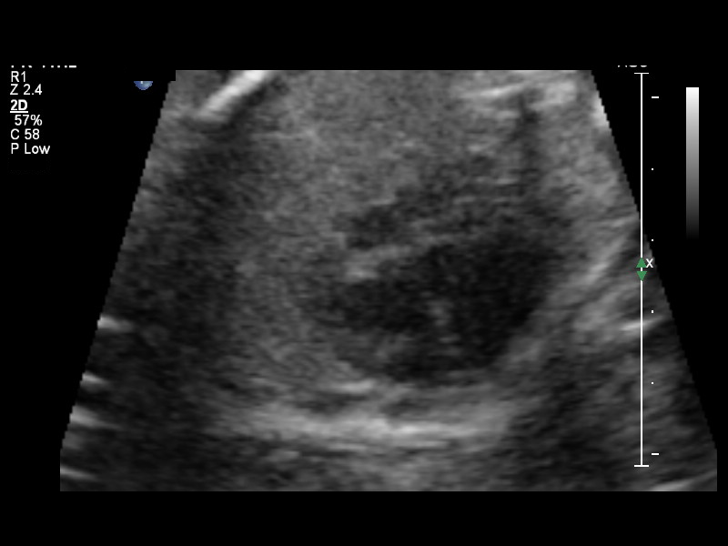
[im 45/56]
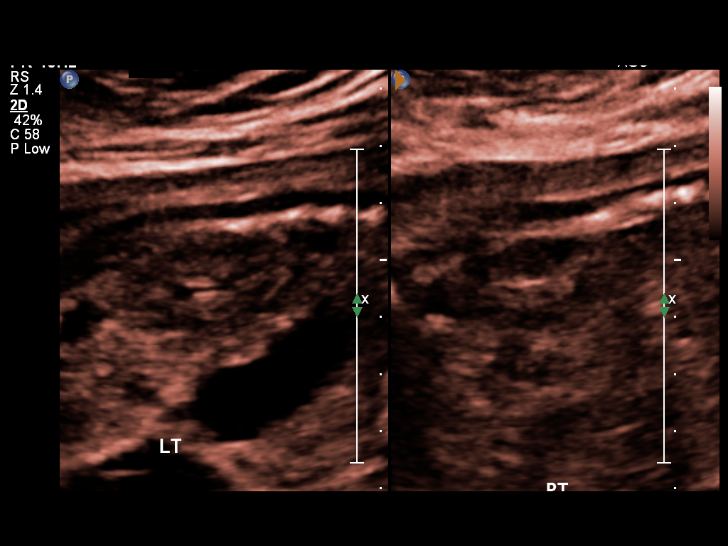
[im 49/56]
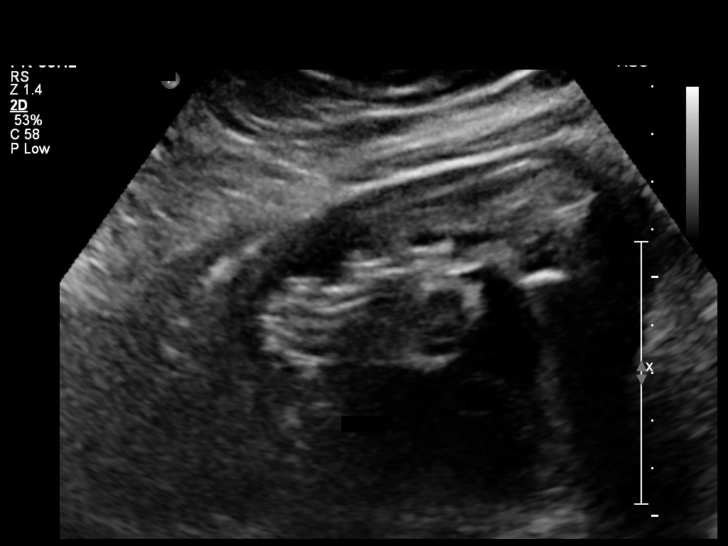
[im 53/56]
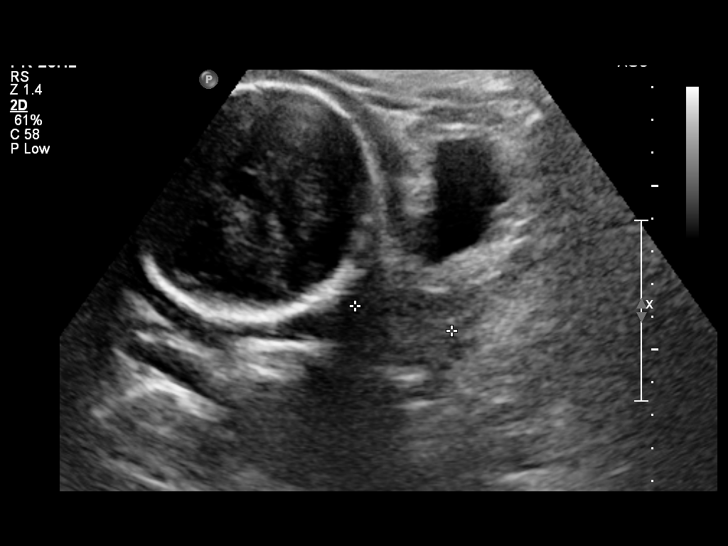

[12 of 28 positions shown; findings below may reference images not displayed]

OBSTETRICS REPORT
                      (Signed Final 06/24/2014 [DATE])

Service(s) Provided

 US OB FOLLOW UP                                       76816.1
Indications

 Follow-up incomplete fetal anatomic evaluation        Z36
 28 weeks gestation of pregnancy
Fetal Evaluation

 Num Of Fetuses:    1
 Fetal Heart Rate:  139                          bpm
 Cardiac Activity:  Observed
 Presentation:      Cephalic
 Placenta:          Posterior, above cervical
                    os
 P. Cord            Previously Visualized
 Insertion:

 Amniotic Fluid
 AFI FV:      Subjectively within normal limits
 AFI Sum:     15.85    cm      57  %Tile      Larg Pckt:   4.88  cm
 RUQ:   2.89    cm   RLQ:    4.88   cm    LUQ:    4.63   cm    LLQ:   3.45    cm
Biometry

 BPD:     73.1   mm    G. Age:  29w 2d                CI:        73.98    70 - 86
                                                      FL/HC:       19.8   18.8 -

 HC:     269.9   mm    G. Age:  29w 3d       59   %   HC/AC:       1.09   1.05 -

 AC:     248.1   mm    G. Age:  29w 0d       70   %   FL/BPD:      73.1   71 - 87
 FL:      53.4   mm    G. Age:  28w 3d       41   %   FL/AC:       21.5   20 - 24
 HUM:     48.3   mm    G. Age:  28w 2d       50   %

 Est. FW:    1494   gm    2 lb 14 oz     65  %
Gestational Age

 LMP:           29w 4d        Date:  11/29/13                 EDD:    09/05/14
 U/S Today:     29w 0d                                        EDD:    09/09/14
 Best:          28w 1d     Det. By:  U/S (05/01/14)           EDD:    09/15/14
Anatomy
 Cranium:          Previously seen        Aortic Arch:       Appears normal
 Fetal Cavum:      Previously seen        Ductal Arch:       Not well visualized
 Ventricles:       Previously seen        Diaphragm:         Previously seen
 Choroid Plexus:   Previously seen        Stomach:           Appears normal, left
                                                             sided
 Cerebellum:       Previously seen        Abdomen:           Previously seen
 Posterior Fossa:  Previously seen        Abdominal Wall:    Previously seen
 Nuchal Fold:      Not applicable (>20    Cord Vessels:      Previously seen
                   wks GA)
 Face:             Orbits and profile     Kidneys:           Appear normal
                   previously seen
 Lips:             Not well visualized    Bladder:           Appears normal
 Heart:            Appears normal         Spine:             Previously seen
                   (4CH, axis, and
                   situs)
 RVOT:             Not well visualized    Lower              Previously seen
                                          Extremities:
 LVOT:             Not well visualized    Upper              Previously seen
                                          Extremities:

 Other:  Fetus appears to be a female. Technically difficult due to maternal
         habitus and fetal position.
Cervix Uterus Adnexa

 Cervical Length:    3.21      cm

 Cervix:       Normal appearance by transabdominal scan.
 Uterus:       No abnormality visualized.
 Left Ovary:    Not visualized.
 Right Ovary:   Not visualized.

 Adnexa:     No abnormality visualized. No adnexal mass visualized.
Impression

 SIUP at 28+1weeks
 Normal interval anatomy; limited views of outflow tracts, DA
 and face
 Normal amniotic fluid volume
 Appropriate interval growth with EFW at the 65th %tile
Recommendations

 Follow-up as clinically indicated

 RIKOS with us.  Please do not hesitate to contact

## 2018-01-07 ENCOUNTER — Ambulatory Visit (INDEPENDENT_AMBULATORY_CARE_PROVIDER_SITE_OTHER): Payer: Self-pay | Admitting: General Practice

## 2018-01-07 ENCOUNTER — Encounter: Payer: Self-pay | Admitting: Family Medicine

## 2018-01-07 DIAGNOSIS — B379 Candidiasis, unspecified: Secondary | ICD-10-CM

## 2018-01-07 DIAGNOSIS — Z3200 Encounter for pregnancy test, result unknown: Secondary | ICD-10-CM

## 2018-01-07 DIAGNOSIS — Z3201 Encounter for pregnancy test, result positive: Secondary | ICD-10-CM

## 2018-01-07 LAB — POCT PREGNANCY, URINE: PREG TEST UR: POSITIVE — AB

## 2018-01-07 MED ORDER — TERCONAZOLE 0.4 % VA CREA: 1.0000 | TOPICAL_CREAM | Freq: Every day | VAGINAL | 0 refills | Status: DC

## 2018-01-07 NOTE — Progress Notes (Signed)
Patient presents to office today for UPT. UPT +. Patient reports first positive home test 2 weeks ago. Patient reports off/on cramps for a couple weeks. Patient denies bleeding. LMP 11/20/17 EDD 08/27/18 2789w6d. Patient denies taking any meds/vitamins. Patient reports a lot of vaginal itching. Recommended she begin taking a PNV. Terazol sent to pharmacy per protocol for yeast infection. Recommended she begin OB care. Discussed going to [facility]U if pain becomes constant or severe, bleeding, or difficulty breathing. Patient verbalized understanding & had no questions.

## 2018-01-11 NOTE — Progress Notes (Signed)
I have reviewed this chart and agree with the RN/CMA assessment and management.    Oliviana Mcgahee C Aron Needles, MD, FACOG Attending Physician, Faculty Practice [facility]  

## 2018-02-08 ENCOUNTER — Ambulatory Visit: Payer: Self-pay | Admitting: Nurse Practitioner

## 2018-02-11 ENCOUNTER — Encounter: Payer: Self-pay | Admitting: Obstetrics and Gynecology

## 2018-02-11 ENCOUNTER — Ambulatory Visit (INDEPENDENT_AMBULATORY_CARE_PROVIDER_SITE_OTHER): Payer: Self-pay | Admitting: Obstetrics and Gynecology

## 2018-02-11 DIAGNOSIS — Z113 Encounter for screening for infections with a predominantly sexual mode of transmission: Secondary | ICD-10-CM

## 2018-02-11 DIAGNOSIS — Z3480 Encounter for supervision of other normal pregnancy, unspecified trimester: Secondary | ICD-10-CM

## 2018-02-11 DIAGNOSIS — Z124 Encounter for screening for malignant neoplasm of cervix: Secondary | ICD-10-CM

## 2018-02-11 DIAGNOSIS — Z3481 Encounter for supervision of other normal pregnancy, first trimester: Secondary | ICD-10-CM

## 2018-02-11 NOTE — Patient Instructions (Addendum)
AREA PEDIATRIC/FAMILY PRACTICE PHYSICIANS  [facility] [address], [address] [place], [place]  [address] Phone - [phone]   Fax - [phone]  [facility] [address] [place], [place] [address] Phone - [phone]   Fax - [phone]  [facility] [address] [place], [place]  [address] Phone - [phone]   Fax - [phone]  [facility] [address] [place], [place]  [address] Phone - [phone]   Fax - [phone]  [facility] [address] [place], [place]  [address] Phone - [phone]   Fax - [phone]  [facility] [address], [address] [place], [place]  [address] Phone - [phone]   Fax - [phone]  [facility] [address], [address] [place], [place]  [address] Phone - [phone]   Fax - [phone]  [facility] [address], [address] [place], [place]  [address] Phone - [phone]   Fax - [phone]  [facility] [address] [place], [place]  [address] Phone - [phone]   Fax - [phone] [facility] [address] [place], [place]  [address] Phone - [phone]   Fax - [phone]  [facility] [address] [place], [place]  [address] Phone - [phone]   Fax - [phone]  [facility] [address], [address] [place], [place]  [address] Phone - [phone]   Fax - [phone]  [facility] [address], [address] [place], [place]  [address] Phone - [phone]   Fax - [phone]  [facility] [address], [address] [place], [place]  [address] Phone - [phone]  [facility] [address] [place], [place]  [address] Phone - [phone]   Fax - [phone]  [facility] [address], [address] [place], [place]  [address] Phone - [phone]   Fax - [phone]  [facility] [address] [place], [place]  [address] Phone - [phone]   Fax - [phone]   [facility] [address] [place], [place]  [address] Phone - [phone]   Fax - [phone]   [facility] [address], [address] [place], [place]  [address] Phone - [phone]   Fax - [phone]  [facility] [address], [address] [place], [place]  [address] Phone - [phone]   Fax - [phone]  [facility] [address], [address] [place], [place]  [address] Phone - [phone]   Fax - [phone]  [facility] [address], [address] [place], [place]  [address] Phone - [phone]   Fax - [phone]  [facility] [address] [place], [place]  [address] Phone - [phone]   Fax - [phone] [facility] [place] [address] [place], [place]  [address] Phone - [phone]   Fax - [phone]  [facility] [address] [place], [place]  [address] Phone - [phone]   Fax - [phone]  [facility] [place] [address] [place], [place]  [address] Phone - [phone]   Fax - [phone]  [facility] [place] Wyvonne Lenzharlene Flemming MD [address] [place] [address] Phone [phone]  Fax [phone]  Primer trimestre de Psychiatristembarazo (First Trimester of Pregnancy) El primer trimestre de embarazo se extiende  desde la semana1 hasta el final de la semana12 (mes1 al mes3). Durante este tiempo, el beb comenzar a desarrollarse dentro suyo. Entre la semana6 y Mauckportla8, se forman los ojos y Morrowel rostro, y los latidos del corazn pueden verse en la ecografa. Al final de las 12semanas, todos los rganos del beb estn formados. La atencin prenatal es toda la asistencia mdica que usted recibe antes  del nacimiento del beb. Asegrese de recibir una buena atencin prenatal y de seguir todas las indicaciones del mdico. CUIDADOS EN EL HOGAR Medicamentos:  Tome los medicamentos solamente como se lo haya indicado el mdico. Algunos medicamentos se pueden tomar durante el Psychiatristembarazo y otros no.  Tome las vitaminas prenatales como se lo haya indicado el mdico.  Tome el medicamento que la ayuda a Advertising copywriterdefecar (laxante Pinecrestsuave) segn sea necesario, si el mdico lo autoriza. Dieta  Ingiera alimentos saludables de Lafayettemanera regular.  El Firefightermdico le indicar la cantidad de peso que Green Springspuede aumentar.  No coma carne cruda ni quesos sin cocinar.  Si tiene Programme researcher, broadcasting/film/videomalestar estomacal (nuseas) o vomita: ? Ingiera 4 o 5comidas pequeas por Geophysical data processorda en lugar de 3abundantes. ? Intente comer algunas galletitas saladas. ? Beba lquidos Altria Groupentre las comidas, en lugar de Boston Scientifichacerlo durante estas.  Si tiene dificultad para defecar (estreimiento): ? Consuma alimentos con alto contenido de Iberiafibra, como verduras y frutas frescos, y Radiation protection practitionercereales integrales. ? Beba suficiente lquido para mantener el pis (orina) claro o de color amarillo plido. Actividad y ejercicios  Haga ejercicios solamente como se lo haya indicado el mdico. Deje de hacer ejercicios si tiene clicos o dolor en la parte baja del vientre (abdomen) o en la cintura.  Intente no estar de pie FedExdurante mucho tiempo. Mueva las piernas con frecuencia si debe estar de pie en un lugar durante mucho tiempo.  Evite levantar pesos Fortune Brandsexcesivos.  Use zapatos con tacones bajos. Mantenga una buena postura al sentarse y pararse.  Puede tener The St. Paul Travelersrelaciones sexuales, a menos que el mdico le indique lo contrario. Alivio del dolor o las molestias  Use un sostn que le brinde buen soporte si le duelen las Ooliticmamas.  Dese baos con agua tibia (baos de asiento) para Engineer, materialsaliviar el dolor o las molestias a causa de las hemorroides. Use crema antihemorroidal si el mdico se lo permite.  Descanse con  las piernas elevadas si tiene calambres o dolor de cintura.  Use medias de descanso si tiene las venas de las piernas hinchadas y abultadas (venas varicosas). Eleve los pies durante 15minutos, 3 o 4veces por da. Limite la cantidad de sal en su dieta. Cuidados prenatales  Programe las visitas prenatales para la semana12 de Eurekaembarazo.  Escriba sus preguntas. Llvelas cuando concurra a las visitas prenatales.  Concurra a todas las visitas prenatales como se lo haya indicado el mdico. Seguridad  Colquese el cinturn de seguridad cuando conduzca.  Haga una lista con los nmeros de telfono en caso de Associate Professoremergencia, en la cual deben incluirse los nmeros de los familiares, los amigos, el hospital y los departamentos de polica y de bomberos. Consejos generales  Pdale al mdico que la derive a clases prenatales en su localidad. Debe comenzar a tomar las clases antes de Cytogeneticistentrar en el mes6 de embarazo.  Pida ayuda si necesita asesoramiento o asistencia con la alimentacin. El mdico puede aconsejarla o indicarle dnde recurrir para recibir [place] Vincent and the Grenadinesayuda.  No se d baos de inmersin en agua caliente, baos turcos ni saunas.  No se haga duchas vaginales ni use tampones o toallas  higinicas perfumadas.  No mantenga las piernas cruzadas durante [place].  Evite el contacto con las bandejas sanitarias de los gatos y la tierra que estos animales usan.  No fume, no consuma hierbas ni beba alcohol. No tome frmacos que el mdico no haya autorizado.  No consuma ningn producto que contenga tabaco, lo que incluye cigarrillos, tabaco de Theatre manager o Administrator, Civil Service. Si necesita ayuda para dejar de fumar, consulte al American Express. Puede recibir asesoramiento u otro tipo de apoyo para dejar de fumar.  Visite al dentista. En su casa, lvese los dientes con un cepillo dental suave. Psese el hilo dental con suavidad. SOLICITE AYUDA SI:  Tiene mareos.  Tiene clicos leves o siente presin en la parte  baja del vientre.  Siente un dolor persistente en la zona del vientre.  Sigue teniendo AT&T, vomita o las heces son lquidas (diarrea).  Observa una secrecin, con mal olor que proviene de la vagina.  Siente dolor al ConocoPhillips.  Tiene el rostro, las [place], las piernas o los tobillos ms hinchados (inflamados).  SOLICITE AYUDA DE INMEDIATO SI:  Tiene fiebre.  Tiene una prdida de lquido por la vagina.  Tiene sangrado o pequeas prdidas vaginales.  Tiene clicos o dolor muy intensos en el vientre.  Sube o baja de peso rpidamente.  Vomita sangre. Puede ser similar a la borra del caf  Est en contacto con personas que tienen rubola, la quinta enfermedad o varicela.  Siente un dolor de cabeza muy intenso.  Le falta el aire.  Sufre cualquier tipo de traumatismo, por ejemplo, debido a una cada o un accidente automovilstico.  Esta informacin no tiene Theme park manager el consejo del mdico. Asegrese de hacerle al mdico cualquier pregunta que tenga. Document Released: 09/08/2008 Document Revised: 07/03/2014 Document Reviewed: 04/22/2013 Elsevier Interactive Patient Education  2017 Arvin[employer].

## 2018-02-11 NOTE — Progress Notes (Signed)
Subjective:  Kim Blair is a 27 y.o. G2P1001 at 3351w6d being seen today for first OB visit. EDD by certain LMP. No chronic medical problems or medications. H/O TSVD without problems.  She is currently monitored for the following issues for this low-risk pregnancy and has Supervision of other normal pregnancy, antepartum on their problem list.  Patient reports no complaints.  Contractions: Not present. Vag. Bleeding: None.  Movement: Absent. Denies leaking of fluid.   The following portions of the patient's history were reviewed and updated as appropriate: allergies, current medications, past family history, past medical history, past social history, past surgical history and problem list. Problem list updated.  Objective:   Vitals:   02/11/18 0855  BP: 110/67  Pulse: 64  Weight: 187 lb (84.8 kg)    Fetal Status: Fetal Heart Rate (bpm): 151   Movement: Absent     General:  Alert, oriented and cooperative. Patient is in no acute distress.  Skin: Skin is warm and dry. No rash noted.   Cardiovascular: Normal heart rate noted  Respiratory: Normal respiratory effort, no problems with respiration noted  Abdomen: Soft, gravid, appropriate for gestational age. Pain/Pressure: Present     Pelvic:  Cervical exam performed        Extremities: Normal range of motion.  Edema: None  Mental Status: Normal mood and affect. Normal behavior. Normal judgment and thought content.  Breast sym supple no nipple discharge or adenopathy  Urinalysis:      Assessment and Plan:  Pregnancy: G2P1001 at 5651w6d  1. Supervision of other normal pregnancy, antepartum Prenatal care and labs reviewed with pt. Continue with qd PNV - Culture, OB Urine - Cystic fibrosis gene test - Genetic Screening - Hemoglobinopathy Evaluation - Obstetric Panel, Including HIV - SMN1 COPY NUMBER ANALYSIS (SMA Carrier Screen) - US MFM OB COMP + 14 WK; Future - Cytology - PAP  Preterm labor symptoms and general obstetric  precautions including but not limited to vaginal bleeding, contractions, leaking of fluid and fetal movement were reviewed in detail with the patient. Please refer to After Visit Summary for other counseling recommendations.  Return in about 4 weeks (around 03/11/2018) for OB visit.   Hermina StaggersErvin, Michael L, MD

## 2018-02-11 NOTE — Progress Notes (Signed)
[employer] Office DepotSpanish Interpreter Hugo [phone]#[id number]

## 2018-02-12 LAB — CYTOLOGY - PAP
Chlamydia: NEGATIVE
DIAGNOSIS: NEGATIVE
NEISSERIA GONORRHEA: NEGATIVE

## 2018-02-13 LAB — CULTURE, OB URINE

## 2018-02-13 LAB — URINE CULTURE, OB REFLEX

## 2018-02-19 LAB — SMN1 COPY NUMBER ANALYSIS (SMA CARRIER SCREENING)

## 2018-02-19 LAB — HEMOGLOBINOPATHY EVALUATION
Ferritin: 164 ng/mL — ABNORMAL HIGH (ref 15–150)
HGB A: 97.9 % (ref 96.4–98.8)
HGB SOLUBILITY: NEGATIVE
Hgb A2 Quant: 2.1 % (ref 1.8–3.2)
Hgb C: 0 %
Hgb F Quant: 0 % (ref 0.0–2.0)
Hgb S: 0 %
Hgb Variant: 0 %

## 2018-02-19 LAB — OBSTETRIC PANEL, INCLUDING HIV
BASOS: 1 %
Basophils Absolute: 0.1 10*3/uL (ref 0.0–0.2)
EOS (ABSOLUTE): 0.1 10*3/uL (ref 0.0–0.4)
EOS: 1 %
HEMATOCRIT: 39 % (ref 34.0–46.6)
HEMOGLOBIN: 13 g/dL (ref 11.1–15.9)
HIV Screen 4th Generation wRfx: NONREACTIVE
Hepatitis B Surface Ag: NEGATIVE
Immature Grans (Abs): 0 10*3/uL (ref 0.0–0.1)
Immature Granulocytes: 0 %
LYMPHS ABS: 2.7 10*3/uL (ref 0.7–3.1)
Lymphs: 40 %
MCH: 30 pg (ref 26.6–33.0)
MCHC: 33.3 g/dL (ref 31.5–35.7)
MCV: 90 fL (ref 79–97)
MONOS ABS: 0.5 10*3/uL (ref 0.1–0.9)
Monocytes: 8 %
NEUTROS ABS: 3.4 10*3/uL (ref 1.4–7.0)
Neutrophils: 50 %
Platelets: 222 10*3/uL (ref 150–450)
RBC: 4.34 x10E6/uL (ref 3.77–5.28)
RDW: 12.9 % (ref 12.3–15.4)
RH TYPE: POSITIVE
RPR Ser Ql: NONREACTIVE
Rubella Antibodies, IGG: 2.94 index (ref >0.99)
WBC: 6.7 10*3/uL (ref 3.4–10.8)

## 2018-02-19 LAB — AB SCR+ANTIBODY ID

## 2018-02-19 LAB — CYSTIC FIBROSIS GENE TEST

## 2018-02-27 ENCOUNTER — Encounter: Payer: Self-pay | Admitting: *Deleted

## 2018-03-04 ENCOUNTER — Encounter: Payer: Self-pay | Admitting: *Deleted

## 2018-03-11 ENCOUNTER — Ambulatory Visit (INDEPENDENT_AMBULATORY_CARE_PROVIDER_SITE_OTHER): Payer: Self-pay | Admitting: Obstetrics & Gynecology

## 2018-03-11 VITALS — BP 108/60 | HR 66 | Wt 187.4 lb

## 2018-03-11 DIAGNOSIS — Z3482 Encounter for supervision of other normal pregnancy, second trimester: Secondary | ICD-10-CM

## 2018-03-11 DIAGNOSIS — O2602 Excessive weight gain in pregnancy, second trimester: Secondary | ICD-10-CM

## 2018-03-11 DIAGNOSIS — Z3480 Encounter for supervision of other normal pregnancy, unspecified trimester: Secondary | ICD-10-CM

## 2018-03-11 LAB — HEMOGLOBIN A1C
ESTIMATED AVERAGE GLUCOSE: 111 mg/dL
HEMOGLOBIN A1C: 5.5 % (ref 4.8–5.6)

## 2018-03-11 NOTE — Progress Notes (Signed)
   PRENATAL VISIT NOTE  Subjective:  Kim Blair is a 27 y.o. G2P1001 at 2819w6d being seen today for ongoing prenatal care.  She is currently monitored for the following issues for this low-risk pregnancy and has Supervision of other normal pregnancy, antepartum on their problem list.  Patient reports no complaints.  Contractions: Not present. Vag. Bleeding: None.  Movement: Absent. Denies leaking of fluid.   The following portions of the patient's history were reviewed and updated as appropriate: allergies, current medications, past family history, past medical history, past social history, past surgical history and problem list. Problem list updated.  Objective:   Vitals:   03/11/18 0831  BP: 108/60  Pulse: 66  Weight: 187 lb 6.4 oz (85 kg)    Fetal Status: Fetal Heart Rate (bpm): 145   Movement: Absent     General:  Alert, oriented and cooperative. Patient is in no acute distress.  Skin: Skin is warm and dry. No rash noted.   Cardiovascular: Normal heart rate noted  Respiratory: Normal respiratory effort, no problems with respiration noted  Abdomen: Soft, gravid, appropriate for gestational age.  Pain/Pressure: Present     Pelvic: Cervical exam deferred        Extremities: Normal range of motion.  Edema: None  Mental Status: Normal mood and affect. Normal behavior. Normal judgment and thought content.   Assessment and Plan:  Pregnancy: G2P1001 at 319w6d  1. Supervision of other normal pregnancy, antepartum She declines genetic testing as of today We discussed her weight gain today and the risks of excessive weight gain including stillbirth. She declines a dietician consult. Check HBA1C today Anatomy u/s ordered  Preterm labor symptoms and general obstetric precautions including but not limited to vaginal bleeding, contractions, leaking of fluid and fetal movement were reviewed in detail with the patient. Please refer to After Visit Summary for other counseling  recommendations.  No follow-ups on file.  No future appointments.  Allie BossierMyra C Verble Styron, MD

## 2018-04-08 ENCOUNTER — Ambulatory Visit (INDEPENDENT_AMBULATORY_CARE_PROVIDER_SITE_OTHER): Payer: Self-pay | Admitting: Advanced Practice Midwife

## 2018-04-08 ENCOUNTER — Encounter: Payer: Self-pay | Admitting: Advanced Practice Midwife

## 2018-04-08 DIAGNOSIS — Z3480 Encounter for supervision of other normal pregnancy, unspecified trimester: Secondary | ICD-10-CM

## 2018-04-08 NOTE — Progress Notes (Signed)
Pt is concerned that the baby is only moving slowly.Baby moves dailey

## 2018-04-08 NOTE — Progress Notes (Signed)
   PRENATAL VISIT NOTE  Subjective:  Kim Blair is a 27 y.o. G2P1001 at [redacted]w[redacted]d being seen today for ongoing prenatal care.  She is currently monitored for the following issues for this low-risk pregnancy and has Supervision of other normal pregnancy, antepartum on their problem list.  Patient reports no complaints.  Contractions: Not present. Vag. Bleeding: None.  Movement: Present. Denies leaking of fluid.   The following portions of the patient's history were reviewed and updated as appropriate: allergies, current medications, past family history, past medical history, past social history, past surgical history and problem list. Problem list updated.  Objective:   Vitals:   04/08/18 1037  BP: (!) 100/58  Pulse: 63  Weight: 188 lb 4.8 oz (85.4 kg)    Fetal Status: Fetal Heart Rate (bpm): 149 Fundal Height: 20 cm Movement: Present     General:  Alert, oriented and cooperative. Patient is in no acute distress.  Skin: Skin is warm and dry. No rash noted.   Cardiovascular: Normal heart rate noted  Respiratory: Normal respiratory effort, no problems with respiration noted  Abdomen: Soft, gravid, appropriate for gestational age.  Pain/Pressure: Present     Pelvic: Cervical exam deferred        Extremities: Normal range of motion.  Edema: None  Mental Status: Normal mood and affect. Normal behavior. Normal judgment and thought content.   Assessment and Plan:  Pregnancy: G2P1001 at [redacted]w[redacted]d  1. Supervision of other normal pregnancy, antepartum - Routine care  Preterm labor symptoms and general obstetric precautions including but not limited to vaginal bleeding, contractions, leaking of fluid and fetal movement were reviewed in detail with the patient. Please refer to After Visit Summary for other counseling recommendations.  Return in about 4 weeks (around 05/06/2018).  Future Appointments  Date Time Provider Department Center  04/22/2018  8:45 AM WH-MFC [place] 2 WH-MFCUS MFC-US      Thressa Sheller, CNM

## 2018-04-15 ENCOUNTER — Encounter (HOSPITAL_COMMUNITY): Payer: Self-pay

## 2018-04-22 ENCOUNTER — Ambulatory Visit (HOSPITAL_COMMUNITY)
Admission: RE | Admit: 2018-04-22 | Discharge: 2018-04-22 | Disposition: A | Discharge status: Home / Self Care | Payer: Self-pay | Source: Ambulatory Visit | Attending: Obstetrics and Gynecology | Admitting: Obstetrics and Gynecology

## 2018-04-22 ENCOUNTER — Other Ambulatory Visit: Payer: Self-pay | Admitting: Obstetrics and Gynecology

## 2018-04-22 DIAGNOSIS — Z363 Encounter for antenatal screening for malformations: Secondary | ICD-10-CM

## 2018-04-22 DIAGNOSIS — O99212 Obesity complicating pregnancy, second trimester: Secondary | ICD-10-CM

## 2018-04-22 DIAGNOSIS — Z3480 Encounter for supervision of other normal pregnancy, unspecified trimester: Secondary | ICD-10-CM

## 2018-04-22 DIAGNOSIS — Z3A21 21 weeks gestation of pregnancy: Secondary | ICD-10-CM

## 2018-04-22 DIAGNOSIS — E669 Obesity, unspecified: Secondary | ICD-10-CM | POA: Insufficient documentation

## 2018-04-22 DIAGNOSIS — Z3689 Encounter for other specified antenatal screening: Secondary | ICD-10-CM | POA: Insufficient documentation

## 2018-05-06 ENCOUNTER — Ambulatory Visit (INDEPENDENT_AMBULATORY_CARE_PROVIDER_SITE_OTHER): Payer: Self-pay | Admitting: Family Medicine

## 2018-05-06 ENCOUNTER — Encounter: Payer: Self-pay | Admitting: Family Medicine

## 2018-05-06 VITALS — BP 109/63 | HR 71 | Wt 191.3 lb

## 2018-05-06 DIAGNOSIS — M5432 Sciatica, left side: Secondary | ICD-10-CM

## 2018-05-06 DIAGNOSIS — Z23 Encounter for immunization: Secondary | ICD-10-CM

## 2018-05-06 DIAGNOSIS — Z3482 Encounter for supervision of other normal pregnancy, second trimester: Secondary | ICD-10-CM

## 2018-05-06 DIAGNOSIS — Z3480 Encounter for supervision of other normal pregnancy, unspecified trimester: Secondary | ICD-10-CM

## 2018-05-06 NOTE — Patient Instructions (Addendum)
AREA PEDIATRIC/FAMILY PRACTICE PHYSICIANS  [facility] [address] [place], [place]  [address] Phone - [phone]   Fax - [phone]  [facility] [address] [place], [place] [address] Phone - [phone]   Fax - [phone]  JACK AMOS [address] [place], [place]  [address] Phone - [phone]   Fax - [phone]  [facility] [address] [place], [place]  [address] Phone - [phone]   Fax - [phone]  [facility] [address] [place], [place]  [address] Phone - [phone]   Fax - [phone]  [facility] [address] [place], [place]  [address] Phone - [phone]   Fax - [phone]  [facility] [address] [place], [place]  [address] Phone - [phone]   Fax - [phone]  [facility] AT [facility] [address] [place], [address] [place], [place]  [address] Phone - [phone]   Fax - [phone]  [facility] AT [facility] [address] [place], [place]  [address] Phone - [phone]   Fax - [phone] [facility] [place] [address] [place], [place]  [address] Phone - [phone]   Fax - [phone]  [facility] [address] [place], [place]  [address] Phone - [phone]   Fax - [phone]  [facility] [address], [address] [place], [place]  [address] Phone - [phone]   Fax - [phone]  [facility] [place] [address] [place], [place]  [address] Phone - [phone]   Fax - [phone]  [facility] [address], [address] [place], [place]  [address] Phone - [phone]  [facility] [address] [place], [place]  [address] Phone - [phone]   Fax - [phone]  [facility] [address] [place], [place]  [address] Phone - [phone]   Fax - [phone]  [facility] [address] [place], [place]  [address] Phone - [phone]   Fax - [phone]  [facility] [address] [place], [place]  [address] Phone - [phone]   Fax - [phone]   [facility] [address] [place], [place]  [address] Phone - [phone]   Fax - [phone]  [facility] [address] [place], [place]  [address] Phone - [phone]   Fax - [phone]  DAVID RUBIN [address] [place], [place]  [address] Phone - [phone]   Fax - [phone]  [facility] [address] [place], [place]  [address] Phone - [phone]   Fax - [phone]  [facility] Alita Chyle [address] [place], [place]  [address] Phone - [phone]   Fax - [phone] Gerarda Fraction [address] [place], [place]  [address] Phone - [phone]   Fax - [phone]  [facility] [address] [place], [place]  [address] Phone - [phone]   Fax - [phone]  [facility] - [place] [address] [place], [place]  [address] Phone - [phone]   Fax - [phone]  [facility] [place] Wyvonne Lenz MD [address] [place] [address] Phone [phone]  Fax [phone]    Prueba de tolerancia a la glucosa durante el embarazo Glucose Tolerance Test During  Pregnancy La prueba de tolerancia a la glucosa es un anlisis de sangre que se [place] para determinar si ha contrado un tipo de diabetes durante el embarazo (diabetes gestacional). Esto es cuando el cuerpo no procesa de forma Arboriculturist (glucosa) en los alimentos que come, lo cual provoca niveles sanguneos altos de glucosa. Por lo general, la PTG se realiza despus de haberse hecho una prueba de  glucosa de 1 hora, cuyos resultados indican que posiblemente tiene diabetes gestacional. Tambin se puede hacer si:  Tiene antecedentes de haber parido bebs muy grandes o antecedentes de muerte fetal repetida (beb nacido muerto).  Tiene signos o sntomas de diabetes, tales como: ? Cambios en la visin. ? Hormigueo o adormecimiento en las manos o los pies. ? Cambios en el hambre, la sed y la miccin que no se explican por Firefighter.  La PTG dura unas 3 horas. Le darn para beber una solucin de agua y azcar al principio de la prueba. Le extraern sangre antes de que beba la solucin, y 1, 2 y 3 horas despus de beberla. No se le permitir comer ni beber nada ms durante la prueba. Debe Geneticist, molecular en que se realiza la prueba para asegurarse de que la sangre se extraiga puntualmente. Tambin debe evitar realizar ejercicios durante la prueba porque esto puede [employer]. Cmo debo prepararme para esta prueba? Coma normalmente durante 3 das antes de la PTG e incluya muchos alimentos ricos en carbohidratos. No coma ni beba nada, excepto agua, durante las ltimas 12 horas antes de la prueba. Adems, el mdico puede pedirle que deje de tomar ciertos medicamentos antes de la prueba. Qu significan los resultados? Es su responsabilidad retirar el resultado del [place]. Consulte en el laboratorio o en el departamento en el que fue realizado el estudio cundo y cmo podr [employer]. Comunquese con el mdico si tiene [employer]. Rango de [employer] rangos para los valores normales pueden variar entre diferentes laboratorios y hospitales. Siempre debe consultar a su mdico despus de realizarse un anlisis u otros estudios para saber si los valores de sus [place] se consideran dentro de los lmites normales. Los niveles normales de glucemia son los siguientes:  Ayuno: menos de 105mg /dl.  Una hora despus de beber la solucin: menos  de 190mg /dl.  Dos horas despus de beber la solucin: menos de 165mg /dl.  Tres horas despus de beber la solucin: menos de 145mg /dl.  Algunas sustancias pueden [employer] de la PTG. Estos pueden incluir lo siguiente:  Medicamentos para la presin arterial y la insuficiencia cardaca, como betabloqueantes, furosemida y tiacidas.  Antiinflamatorios, como aspirina.  La nicotina.  Algunos medicamentos psiquitricos.  Significado de los Ball Corporation estn fuera de los rangos de los valores normales Los resultados de la PTG por debajo de los valores normales pueden indicar varios problemas de [place], tales como:  Diabetes gestacional.  Respuesta al estrs agudo.  Sndrome de [place].  Tumores como feocromocitoma o glucagonoma.  Problemas renales de larga duracin.  Pancreatitis.  Hipertiroidismo.  Infeccin actual.  Hable con el mdico [employer]. El mdico [employer] para Education officer, environmental un diagnstico y Chief Strategy Officer un plan de tratamiento adecuado para usted. Esta informacin no tiene Theme park manager el consejo del mdico. Asegrese de hacerle al mdico cualquier pregunta que tenga. Document Released: 12/12/2011 Document Revised: 08/31/2016 Document Reviewed: 10/17/2013 Elsevier Interactive Patient Education  [employer].

## 2018-05-06 NOTE — Progress Notes (Signed)
   PRENATAL VISIT NOTE Subjective:  Kim Blair is a 27 y.o. G2P1001 at [redacted]w[redacted]d being seen today for ongoing prenatal care.  She is currently monitored for the following issues for this low-risk pregnancy and has Supervision of other normal pregnancy, antepartum on their problem list.  Pregnancy - flu shot - okay to get  - feeding - breastfed and bottle fed with going back to work, planning to do again - doesn't like pediatrician, looking for new one because difficult to get scheduled   Hip Pain - left side in buttocks, goes down back of thigh to knee - worse with getting out of bed and when walking long time - sharp and achy - no weakness or falls   Patient reports hip pain..  Contractions: Not present. Vag. Bleeding: None.  Movement: Present. Denies leaking of fluid.   The following portions of the patient's history were reviewed and updated as appropriate: allergies, current medications, past family history, past medical history, past social history, past surgical history and problem list. Problem list updated.  Objective:   Vitals:   05/06/18 0857  BP: 109/63  Pulse: 71  Weight: 191 lb 4.8 oz (86.8 kg)    Fetal Status: Fetal Heart Rate (bpm): 143   Movement: Present     General:  Alert, oriented and cooperative. Patient is in no acute distress.  Skin: Skin is warm and dry. No rash noted.   Cardiovascular: Normal heart rate noted  Respiratory: Normal respiratory effort, no problems with respiration noted  Abdomen: Soft, gravid, appropriate for gestational age.  Pain/Pressure: Present     Pelvic: Cervical exam deferred        Extremities: Normal range of motion.  Edema: None  Strength 5/5 LEs, +FABRE/-FADIR/-SLR  Mental Status: Normal mood and affect. Normal behavior. Normal judgment and thought content.   Assessment and Plan:  Pregnancy: G2P1001 at [redacted]w[redacted]d  1. Supervision of other normal pregnancy, antepartum -- prenatal record reviewed and UTD  -- ordered  28-week labs  -- flu shot given  2. Sciatica -- counseled on likely cause and relation to pregnancy -- recommended position changes, heat for comfort, tylenol prn -- reviewed return precautions   Preterm labor symptoms and general obstetric precautions including but not limited to vaginal bleeding, contractions, leaking of fluid and fetal movement were reviewed in detail with the patient. Please refer to After Visit Summary for other counseling recommendations.   Return in about 4 weeks (around 06/03/2018) for LROB.  Future Appointments  Date Time Provider Department Center  06/03/2018  8:15 AM Armando Reichert, CNM [facility] W[facility]  06/03/2018  8:50 AM WOC-[facility] LAB [facility] WOC    Tamera Stands, DO

## 2018-05-09 ENCOUNTER — Encounter: Payer: Self-pay | Admitting: Obstetrics and Gynecology

## 2018-06-03 ENCOUNTER — Ambulatory Visit (INDEPENDENT_AMBULATORY_CARE_PROVIDER_SITE_OTHER): Payer: Self-pay | Admitting: Advanced Practice Midwife

## 2018-06-03 ENCOUNTER — Other Ambulatory Visit: Payer: Self-pay

## 2018-06-03 ENCOUNTER — Encounter: Payer: Self-pay | Admitting: Advanced Practice Midwife

## 2018-06-03 VITALS — BP 100/65 | HR 64 | Wt 191.0 lb

## 2018-06-03 DIAGNOSIS — Z3480 Encounter for supervision of other normal pregnancy, unspecified trimester: Secondary | ICD-10-CM

## 2018-06-03 DIAGNOSIS — Z23 Encounter for immunization: Secondary | ICD-10-CM

## 2018-06-03 DIAGNOSIS — Z3482 Encounter for supervision of other normal pregnancy, second trimester: Secondary | ICD-10-CM

## 2018-06-03 NOTE — Progress Notes (Signed)
   PRENATAL VISIT NOTE  Subjective:  Kim Blair is a 27 y.o. G2P1001 at [redacted]w[redacted]d being seen today for ongoing prenatal care.  She is currently monitored for the following issues for this low-risk pregnancy and has Supervision of other normal pregnancy, antepartum and Sciatica of left side on their problem list.  Patient reports no complaints.  Contractions: Not present. Vag. Bleeding: None.  Movement: Present. Denies leaking of fluid.   The following portions of the patient's history were reviewed and updated as appropriate: allergies, current medications, past family history, past medical history, past social history, past surgical history and problem list. Problem list updated.  Objective:   Vitals:   06/03/18 0829  BP: 100/65  Pulse: 64  Weight: 191 lb (86.6 kg)    Fetal Status: Fetal Heart Rate (bpm): 138 Fundal Height: 28 cm Movement: Present     General:  Alert, oriented and cooperative. Patient is in no acute distress.  Skin: Skin is warm and dry. No rash noted.   Cardiovascular: Normal heart rate noted  Respiratory: Normal respiratory effort, no problems with respiration noted  Abdomen: Soft, gravid, appropriate for gestational age.  Pain/Pressure: Absent     Pelvic: Cervical exam deferred        Extremities: Normal range of motion.  Edema: None  Mental Status: Normal mood and affect. Normal behavior. Normal judgment and thought content.   Assessment and Plan:  Pregnancy: G2P1001 at 586w6d  1. Supervision of other normal pregnancy, antepartum - Routine care - 2 hour GTT today  - US MFM OB FOLLOW UP; Future  Preterm labor symptoms and general obstetric precautions including but not limited to vaginal bleeding, contractions, leaking of fluid and fetal movement were reviewed in detail with the patient. Please refer to After Visit Summary for other counseling recommendations.  Return in about 2 weeks (around 06/17/2018).  Future Appointments  Date Time Provider  Department Center  06/10/2018  2:15 PM WH-MFC US 4 WH-MFCUS MFC-US  06/14/2018  8:55 AM Jud[facility] HornLawrence, Erin[facility]    Thressa ShellerHeather Hogan, CNM

## 2018-06-04 LAB — RPR: RPR Ser Ql: NONREACTIVE

## 2018-06-04 LAB — CBC
Hematocrit: 33.6 % — ABNORMAL LOW (ref 34.0–46.6)
Hemoglobin: 11.3 g/dL (ref 11.1–15.9)
MCH: 31 pg (ref 26.6–33.0)
MCHC: 33.6 g/dL (ref 31.5–35.7)
MCV: 92 fL (ref 79–97)
Platelets: 178 10*3/uL (ref 150–450)
RBC: 3.64 x10E6/uL — ABNORMAL LOW (ref 3.77–5.28)
RDW: 12.5 % (ref 12.3–15.4)
WBC: 7.5 10*3/uL (ref 3.4–10.8)

## 2018-06-04 LAB — GLUCOSE TOLERANCE, 2 HOURS W/ 1HR
Glucose, 1 hour: 112 mg/dL (ref 65–179)
Glucose, 2 hour: 118 mg/dL (ref 65–152)
Glucose, Fasting: 71 mg/dL (ref 65–91)

## 2018-06-04 LAB — HIV ANTIBODY (ROUTINE TESTING W REFLEX): HIV SCREEN 4TH GENERATION: NONREACTIVE

## 2018-06-10 ENCOUNTER — Ambulatory Visit (HOSPITAL_COMMUNITY)
Admission: RE | Admit: 2018-06-10 | Discharge: 2018-06-10 | Disposition: A | Discharge status: Home / Self Care | Payer: Self-pay | Source: Ambulatory Visit | Attending: Advanced Practice Midwife | Admitting: Advanced Practice Midwife

## 2018-06-10 DIAGNOSIS — Z3480 Encounter for supervision of other normal pregnancy, unspecified trimester: Secondary | ICD-10-CM

## 2018-06-10 DIAGNOSIS — Z3A28 28 weeks gestation of pregnancy: Secondary | ICD-10-CM | POA: Insufficient documentation

## 2018-06-10 DIAGNOSIS — O99213 Obesity complicating pregnancy, third trimester: Secondary | ICD-10-CM | POA: Insufficient documentation

## 2018-06-10 DIAGNOSIS — Z362 Encounter for other antenatal screening follow-up: Secondary | ICD-10-CM | POA: Insufficient documentation

## 2018-06-14 ENCOUNTER — Encounter: Payer: Self-pay | Admitting: Student

## 2018-06-14 ENCOUNTER — Ambulatory Visit (INDEPENDENT_AMBULATORY_CARE_PROVIDER_SITE_OTHER): Payer: Self-pay | Admitting: Student

## 2018-06-14 VITALS — BP 109/53 | HR 75 | Wt 193.5 lb

## 2018-06-14 DIAGNOSIS — Z3480 Encounter for supervision of other normal pregnancy, unspecified trimester: Secondary | ICD-10-CM

## 2018-06-14 DIAGNOSIS — Z789 Other specified health status: Secondary | ICD-10-CM | POA: Insufficient documentation

## 2018-06-14 DIAGNOSIS — Z758 Other problems related to medical facilities and other health care: Secondary | ICD-10-CM | POA: Insufficient documentation

## 2018-06-14 DIAGNOSIS — Z3483 Encounter for supervision of other normal pregnancy, third trimester: Secondary | ICD-10-CM

## 2018-06-14 DIAGNOSIS — M5432 Sciatica, left side: Secondary | ICD-10-CM

## 2018-06-14 NOTE — Progress Notes (Signed)
   PRENATAL VISIT NOTE  Subjective:  Kim Blair is a 27 y.o. G2P1001 at 8023w3d being seen today for ongoing prenatal care.  She is currently monitored for the following issues for this low-risk pregnancy and has Supervision of other normal pregnancy, antepartum and Sciatica of left side on their problem list.  Patient reports sciatica pain.  Contractions: Not present. Vag. Bleeding: None.  Movement: Present. Denies leaking of fluid.   The following portions of the patient's history were reviewed and updated as appropriate: allergies, current medications, past family history, past medical history, past social history, past surgical history and problem list. Problem list updated.  Objective:   Vitals:   06/14/18 0846  BP: (!) 109/53  Pulse: 75  Weight: 193 lb 8 oz (87.8 kg)    Fetal Status: Fetal Heart Rate (bpm): 138 Fundal Height: 30 cm Movement: Present     General:  Alert, oriented and cooperative. Patient is in no acute distress.  Skin: Skin is warm and dry. No rash noted.   Cardiovascular: Normal heart rate noted  Respiratory: Normal respiratory effort, no problems with respiration noted  Abdomen: Soft, gravid, appropriate for gestational age.  Pain/Pressure: Absent     Pelvic: Cervical exam deferred        Extremities: Normal range of motion.  Edema: None  Mental Status: Normal mood and affect. Normal behavior. Normal judgment and thought content.   Assessment and Plan:  Pregnancy: G2P1001 at 7623w3d  1. Supervision of other normal pregnancy, antepartum -Educated on contraceptive methods by tier.  Reviewed risks and benefits of each method.   -Pamphlets given for IUDs; Liletta and Paragard.  2. Sciatica of left side -Educated on methods for relief of sciatica pain including stretching, position changes, and intermittent tylenol usage.  3. Language barrier -Visit completed with assistance of bedside interpreter: Kim Blair  There are no diagnoses linked to this  encounter. Preterm labor symptoms and general obstetric precautions including but not limited to vaginal bleeding, contractions, leaking of fluid and fetal movement were reviewed in detail with the patient. Please refer to After Visit Summary for other counseling recommendations.  Return in about 2 weeks (around 06/28/2018) for ROB.  Future Appointments  Date Time Provider Department Center  07/01/2018  2:55 PM Armando ReichertHogan, Heather D, CNM WOC-WOCA WOC    Cherre RobinsJessica L Shardae Kleinman, CNM  06/14/2018 9:27 AM

## 2018-06-14 NOTE — Patient Instructions (Signed)
Eleccin del mtodo anticonceptivo  Contraception Choices  La anticoncepcin, o los mtodos anticonceptivos, hace referencia a los mtodos o dispositivos que evitan el embarazo.  Mtodos hormonales  Implante anticonceptivo    Un implante anticonceptivo consiste en un tubo plstico delgado que contiene una hormona. Se inserta en la parte superior del brazo. Puede permanecer en el lugar hasta por 3 aos.  Inyecciones de progestina sola  Las inyecciones de progestina sola contienen progestina, una forma sinttica de la hormona progesterona. Un mdico las administra cada 3 meses.  Pldoras anticonceptivas    Las pldoras anticonceptivas son pastillas que contienen hormonas que evitan el embarazo. Deben tomarse una vez al da, preferentemente a la misma hora cada da.  Parches anticonceptivos    El parche anticonceptivo contiene hormonas que evitan el embarazo. Se coloca en la piel, debe cambiarse una vez a la semana durante tres semanas y debe retirarse en la cuarta semana. Se necesita una receta para utilizar este mtodo anticonceptivo.  Anillo vaginal    Un anillo vaginal contiene hormonas que evitan el embarazo. Se coloca en la vagina durante tres semanas y se retira en la cuarta semana. Luego se repite el proceso con un anillo nuevo. Se necesita una receta para utilizar este mtodo anticonceptivo.  Anticonceptivo de emergencia  Los anticonceptivos de emergencia son mtodos para evitar un embarazo despus de tener sexo sin proteccin. Vienen en forma de pldora y pueden tomarse hasta 5 das despus de tener sexo. Funcionan mejor cuando se toman lo ms pronto posible luego de tener sexo. La mayora de los anticonceptivos de emergencia estn disponibles sin receta mdica. Este mtodo no debe utilizarse como el nico mtodo anticonceptivo.  Mtodos de barrera  Preservativo masculino    Un preservativo masculino es una vaina delgada que se coloca sobre el pene durante el sexo. Los preservativos evitan que el esperma  ingrese en el cuerpo de la mujer. Pueden utilizarse con un espermicida para aumentar la efectividad. Deben desecharse luego de su uso.  Preservativo femenino    Un preservativo femenino es una vaina blanda y holgada que se coloca en la vagina antes de tener sexo. El preservativo evita que el esperma ingrese en el cuerpo de la mujer. Deben desecharse luego de su uso.  Diafragma    Un diafragma es una barrera blanda con forma de cpula. Se inserta en la vagina antes del sexo, junto con un espermicida. El diafragma bloquea el ingreso de esperma en el tero, y el espermicida mata a los espermatozoides. El diafragma debe permanecer en la vagina durante 6 a 8 horas despus de tener sexo y debe retirarse en el plazo de las 24 horas.  Un diafragma es recetado y colocado por un mdico. Debe reemplazarse cada 1 a 2 aos, despus de dar a luz, de aumentar ms de 15lb (6,8kg) y de una ciruga plvica.  Capuchn cervical    Un capuchn cervical es una copa redonda y blanda de ltex o plstico que se coloca en el cuello uterino. Se inserta en la vagina antes del sexo, junto con un espermicida. Bloquea el ingreso del esperma en el tero. El capuchn debe permanecer en el lugar durante 6 a 8 horas despus de tener sexo y debe retirarse en el plazo de las 48 horas. Un capuchn cervical debe ser recetado y colocado por un mdico. Debe reemplazarse cada 2aos.  Esponja    Una esponja es una pieza blanda y circular de espuma de poliuretano que contiene espermicida. La esponja ayuda a   bloquear el ingreso de esperma en el tero, y el espermicida mata a los espermatozoides. Para utilizarla, debe humedecerla e insertarla en la vagina. Debe insertarse antes de tener sexo, debe permanecer dentro al menos durante 6 horas despus de tener sexo y debe retirarse y desecharse en el plazo de las 30 horas.  Espermicidas  Los espermicidas son sustancias qumicas que matan o bloquean al esperma y no lo dejan ingresar al cuello uterino y al tero.  Vienen en forma de crema, gel, supositorio, espuma o comprimido. Un espermicida debe insertarse en la vagina con un aplicador al menos 10 o 15 minutos antes de tener sexo para dar tiempo a que surta efecto. El proceso debe repetirse cada vez que tenga sexo. Los espermicidas no requieren receta mdica.  Anticonceptivos intrauterinos  Dispositivo intrauterino (DIU).  Un DIU es un dispositivo en forma de T que se coloca en el tero. Existen dos tipos:   DIU hormonal.Este tipo contiene progestina, una forma sinttica de la hormona progesterona. Este tipo puede permanecer colocado durante 3 a 5 aos.   DIU de cobre.Este tipo est recubierto con un alambre de cobre. Puede permanecer colocado durante 10 aos.    Mtodos anticonceptivos permanentes  Ligadura de trompas en la mujer  En este mtodo, se sellan, atan u obstruyen las trompas de Falopio durante una ciruga para evitar que el vulo descienda hacia el tero.  Esterilizacin histeroscpica  En este mtodo, se coloca un implante pequeo y flexible dentro de cada trompa de Falopio. Los implantes hacen que se forme un tejido cicatricial en las trompas de Falopio y que las obstruya para que el espermatozoide no pueda llegar al vulo. El procedimiento demora alrededor de 3 meses para que sea efectivo. Debe utilizarse otro mtodo anticonceptivo durante esos 3 meses.  Esterilizacin masculina  Este es un procedimiento que consiste en atar los conductos que transportan el esperma (vasectoma). Luego del procedimiento, el hombre puede eyacular lquido (semen).  Mtodos de planificacin natural  Planificacin familiar natural  En este mtodo, la pareja no tiene sexo durante los das en que la mujer podra quedar embarazada.  Mtodo calendario  Esto significa realizar un seguimiento de la duracin de cada ciclo menstrual, identificar los das en los que se puede producir un embarazo y no tener sexo durante esos das.  Mtodo de la ovulacin  En este mtodo, la pareja evita  tener sexo durante la ovulacin.  Mtodo sintotrmico  Este mtodo implica no tener sexo durante la ovulacin. Normalmente, la mujer comprueba la ovulacin al observar cambios en su temperatura y en la consistencia del moco cervical.  Mtodo posovulacin  En este mtodo, la pareja espera a que finalice la ovulacin para tener sexo.  Resumen   La anticoncepcin, o los mtodos anticonceptivos, hace referencia a los mtodos o dispositivos que evitan el embarazo.   Los mtodos anticonceptivos hormonales incluyen implantes, inyecciones, pastillas, parches, anillos vaginales y anticonceptivos de emergencia.   Los mtodos anticonceptivos de barrera pueden incluir preservativos masculinos, preservativos femeninos, diafragmas, capuchones cervicales, esponjas y espermicidas.   Existen dos tipos de DIU (dispositivo intrauterino). Un DIU puede colocarse en el tero de una mujer para evitar el embarazo durante 3 a 5 aos.   La esterilizacin permanente puede realizarse mediante un procedimiento para hombres, mujeres o ambos.   Los mtodos de planificacin familiar natural incluyen no tener sexo durante los das en que la mujer podra quedar embarazada.  Esta informacin no tiene como fin reemplazar el consejo del mdico. Asegrese de   hacerle al mdico cualquier pregunta que tenga.  Document Released: 06/12/2005 Document Revised: 07/14/2017 Document Reviewed: 10/02/2016  Elsevier Interactive Patient Education  2019 Elsevier Inc.

## 2018-06-26 NOTE — L&D Delivery Note (Signed)
Patient: Kim Blair MRN: [id number]  GBS status: Positve, IAP given: PCN   Patient is a 28 y.o. now G2P2 s/p NSVD at [redacted]w[redacted]d, who was admitted for IOL for decreased fetal movement and NRFHT. SROM 5h 71m prior to delivery with clear fluid.    Delivery Note At 7:35 AM a viable female was delivered via Vaginal, Spontaneous (Presentation:OA ).  APGAR: 9, 9; weight 2940g .   Placenta status: spontaneous, intact.  Cord: 3 vessel with the following complications: none.    Anesthesia:  Epidural, 10 cc Lidocaine for repair  Episiotomy:  None  Lacerations:  2nd degree perineal  Suture Repair: 3.0 vicryl rapide Est. Blood Loss (mL):  288   Head delivered OA. No nuchal cord present. Shoulder and body delivered in usual fashion. Infant with spontaneous cry, placed on mother's abdomen, dried and bulb suctioned. Cord clamped x 2 after 1-minute delay, and cut by family member. Cord blood drawn. Noted to have large gush of blood prior to placenta delivery. Placenta delivered spontaneously with gentle cord traction. Continued to have brisk bleeding, cytotec 800 mg rectally given. Fundus firm with massage, Pitocin and Cytotec. Perineum inspected and found to have 2nd degree laceration, which was repaired with 3.0 vicryl rapide with good hemostasis achieved.  Mom to postpartum.  Baby to Couplet care / Skin to Skin.  De Hollingshead 08/29/2018, 8:15 AM

## 2018-07-01 ENCOUNTER — Encounter: Payer: Self-pay | Admitting: Advanced Practice Midwife

## 2018-07-01 ENCOUNTER — Ambulatory Visit (INDEPENDENT_AMBULATORY_CARE_PROVIDER_SITE_OTHER): Payer: Self-pay | Admitting: Advanced Practice Midwife

## 2018-07-01 VITALS — BP 107/69 | HR 88 | Wt 197.6 lb

## 2018-07-01 DIAGNOSIS — Z3480 Encounter for supervision of other normal pregnancy, unspecified trimester: Secondary | ICD-10-CM

## 2018-07-01 DIAGNOSIS — Z3483 Encounter for supervision of other normal pregnancy, third trimester: Secondary | ICD-10-CM

## 2018-07-01 DIAGNOSIS — Z789 Other specified health status: Secondary | ICD-10-CM

## 2018-07-01 NOTE — Progress Notes (Signed)
C/o cramps in hip area.

## 2018-07-01 NOTE — Progress Notes (Signed)
   PRENATAL VISIT NOTE  Subjective:  Kim Blair is a 28 y.o. G2P1001 at [redacted]w[redacted]d being seen today for ongoing prenatal care.  She is currently monitored for the following issues for this low-risk pregnancy and has Supervision of other normal pregnancy, antepartum; Sciatica of left side; and Language barrier on their problem list.  Patient reports no complaints.  Contractions: Not present. Vag. Bleeding: None.  Movement: Present. Denies leaking of fluid.   The following portions of the patient's history were reviewed and updated as appropriate: allergies, current medications, past family history, past medical history, past social history, past surgical history and problem list. Problem list updated.  Objective:   Vitals:   07/01/18 1456  BP: 107/69  Pulse: 88  Weight: 197 lb 9.6 oz (89.6 kg)    Fetal Status: Fetal Heart Rate (bpm): 161 Fundal Height: 31 cm Movement: Present     General:  Alert, oriented and cooperative. Patient is in no acute distress.  Skin: Skin is warm and dry. No rash noted.   Cardiovascular: Normal heart rate noted  Respiratory: Normal respiratory effort, no problems with respiration noted  Abdomen: Soft, gravid, appropriate for gestational age.  Pain/Pressure: Present     Pelvic: Cervical exam deferred        Extremities: Normal range of motion.  Edema: Trace  Mental Status: Normal mood and affect. Normal behavior. Normal judgment and thought content.   Assessment and Plan:  Pregnancy: G2P1001 at [redacted]w[redacted]d  1. Supervision of other normal pregnancy, antepartum - Routine care - Contraception information given for patient to review  - Detailed information on pargard given, and questions answered for patient.   2. Language barrier - In house interpretor used for visit   Preterm labor symptoms and general obstetric precautions including but not limited to vaginal bleeding, contractions, leaking of fluid and fetal movement were reviewed in detail with the  patient. Please refer to After Visit Summary for other counseling recommendations.  Return in about 2 weeks (around 07/15/2018).  No future appointments.  Thressa Sheller DNP, CNM  07/01/18  3:25 PM

## 2018-07-01 NOTE — Patient Instructions (Signed)
Eleccin del mtodo anticonceptivo  Contraception Choices  La anticoncepcin, o los mtodos anticonceptivos, hace referencia a los mtodos o dispositivos que evitan el embarazo.  Mtodos hormonales  Implante anticonceptivo    Un implante anticonceptivo consiste en un tubo plstico delgado que contiene una hormona. Se inserta en la parte superior del brazo. Puede permanecer en el lugar hasta por 3 aos.  Inyecciones de progestina sola  Las inyecciones de progestina sola contienen progestina, una forma sinttica de la hormona progesterona. Un mdico las administra cada 3 meses.  Pldoras anticonceptivas    Las pldoras anticonceptivas son pastillas que contienen hormonas que evitan el embarazo. Deben tomarse una vez al da, preferentemente a la misma hora cada da.  Parches anticonceptivos    El parche anticonceptivo contiene hormonas que evitan el embarazo. Se coloca en la piel, debe cambiarse una vez a la semana durante tres semanas y debe retirarse en la cuarta semana. Se necesita una receta para utilizar este mtodo anticonceptivo.  Anillo vaginal    Un anillo vaginal contiene hormonas que evitan el embarazo. Se coloca en la vagina durante tres semanas y se retira en la cuarta semana. Luego se repite el proceso con un anillo nuevo. Se necesita una receta para utilizar este mtodo anticonceptivo.  Anticonceptivo de emergencia  Los anticonceptivos de emergencia son mtodos para evitar un embarazo despus de tener sexo sin proteccin. Vienen en forma de pldora y pueden tomarse hasta 5 das despus de tener sexo. Funcionan mejor cuando se toman lo ms pronto posible luego de tener sexo. La mayora de los anticonceptivos de emergencia estn disponibles sin receta mdica. Este mtodo no debe utilizarse como el nico mtodo anticonceptivo.  Mtodos de barrera  Preservativo masculino    Un preservativo masculino es una vaina delgada que se coloca sobre el pene durante el sexo. Los preservativos evitan que el esperma  ingrese en el cuerpo de la mujer. Pueden utilizarse con un espermicida para aumentar la efectividad. Deben desecharse luego de su uso.  Preservativo femenino    Un preservativo femenino es una vaina blanda y holgada que se coloca en la vagina antes de tener sexo. El preservativo evita que el esperma ingrese en el cuerpo de la mujer. Deben desecharse luego de su uso.  Diafragma    Un diafragma es una barrera blanda con forma de cpula. Se inserta en la vagina antes del sexo, junto con un espermicida. El diafragma bloquea el ingreso de esperma en el tero, y el espermicida mata a los espermatozoides. El diafragma debe permanecer en la vagina durante 6 a 8 horas despus de tener sexo y debe retirarse en el plazo de las 24 horas.  Un diafragma es recetado y colocado por un mdico. Debe reemplazarse cada 1 a 2 aos, despus de dar a luz, de aumentar ms de 15lb (6,8kg) y de una ciruga plvica.  Capuchn cervical    Un capuchn cervical es una copa redonda y blanda de ltex o plstico que se coloca en el cuello uterino. Se inserta en la vagina antes del sexo, junto con un espermicida. Bloquea el ingreso del esperma en el tero. El capuchn debe permanecer en el lugar durante 6 a 8 horas despus de tener sexo y debe retirarse en el plazo de las 48 horas. Un capuchn cervical debe ser recetado y colocado por un mdico. Debe reemplazarse cada 2aos.  Esponja    Una esponja es una pieza blanda y circular de espuma de poliuretano que contiene espermicida. La esponja ayuda a   bloquear el ingreso de esperma en el tero, y el espermicida mata a los espermatozoides. Para utilizarla, debe humedecerla e insertarla en la vagina. Debe insertarse antes de tener sexo, debe permanecer dentro al menos durante 6 horas despus de tener sexo y debe retirarse y desecharse en el plazo de las 30 horas.  Espermicidas  Los espermicidas son sustancias qumicas que matan o bloquean al esperma y no lo dejan ingresar al cuello uterino y al tero.  Vienen en forma de crema, gel, supositorio, espuma o comprimido. Un espermicida debe insertarse en la vagina con un aplicador al menos 10 o 15 minutos antes de tener sexo para dar tiempo a que surta efecto. El proceso debe repetirse cada vez que tenga sexo. Los espermicidas no requieren receta mdica.  Anticonceptivos intrauterinos  Dispositivo intrauterino (DIU).  Un DIU es un dispositivo en forma de T que se coloca en el tero. Existen dos tipos:   DIU hormonal.Este tipo contiene progestina, una forma sinttica de la hormona progesterona. Este tipo puede permanecer colocado durante 3 a 5 aos.   DIU de cobre.Este tipo est recubierto con un alambre de cobre. Puede permanecer colocado durante 10 aos.    Mtodos anticonceptivos permanentes  Ligadura de trompas en la mujer  En este mtodo, se sellan, atan u obstruyen las trompas de Falopio durante una ciruga para evitar que el vulo descienda hacia el tero.  Esterilizacin histeroscpica  En este mtodo, se coloca un implante pequeo y flexible dentro de cada trompa de Falopio. Los implantes hacen que se forme un tejido cicatricial en las trompas de Falopio y que las obstruya para que el espermatozoide no pueda llegar al vulo. El procedimiento demora alrededor de 3 meses para que sea efectivo. Debe utilizarse otro mtodo anticonceptivo durante esos 3 meses.  Esterilizacin masculina  Este es un procedimiento que consiste en atar los conductos que transportan el esperma (vasectoma). Luego del procedimiento, el hombre puede eyacular lquido (semen).  Mtodos de planificacin natural  Planificacin familiar natural  En este mtodo, la pareja no tiene sexo durante los das en que la mujer podra quedar embarazada.  Mtodo calendario  Esto significa realizar un seguimiento de la duracin de cada ciclo menstrual, identificar los das en los que se puede producir un embarazo y no tener sexo durante esos das.  Mtodo de la ovulacin  En este mtodo, la pareja evita  tener sexo durante la ovulacin.  Mtodo sintotrmico  Este mtodo implica no tener sexo durante la ovulacin. Normalmente, la mujer comprueba la ovulacin al observar cambios en su temperatura y en la consistencia del moco cervical.  Mtodo posovulacin  En este mtodo, la pareja espera a que finalice la ovulacin para tener sexo.  Resumen   La anticoncepcin, o los mtodos anticonceptivos, hace referencia a los mtodos o dispositivos que evitan el embarazo.   Los mtodos anticonceptivos hormonales incluyen implantes, inyecciones, pastillas, parches, anillos vaginales y anticonceptivos de emergencia.   Los mtodos anticonceptivos de barrera pueden incluir preservativos masculinos, preservativos femeninos, diafragmas, capuchones cervicales, esponjas y espermicidas.   Existen dos tipos de DIU (dispositivo intrauterino). Un DIU puede colocarse en el tero de una mujer para evitar el embarazo durante 3 a 5 aos.   La esterilizacin permanente puede realizarse mediante un procedimiento para hombres, mujeres o ambos.   Los mtodos de planificacin familiar natural incluyen no tener sexo durante los das en que la mujer podra quedar embarazada.  Esta informacin no tiene como fin reemplazar el consejo del mdico. Asegrese de   hacerle al mdico cualquier pregunta que tenga.  Document Released: 06/12/2005 Document Revised: 07/14/2017 Document Reviewed: 10/02/2016  Elsevier Interactive Patient Education  2019 Elsevier Inc.

## 2018-07-16 ENCOUNTER — Encounter: Payer: Self-pay | Admitting: Medical

## 2018-07-16 ENCOUNTER — Ambulatory Visit (INDEPENDENT_AMBULATORY_CARE_PROVIDER_SITE_OTHER): Payer: Self-pay | Admitting: Medical

## 2018-07-16 VITALS — BP 124/83 | HR 84 | Wt 200.3 lb

## 2018-07-16 DIAGNOSIS — Z3A34 34 weeks gestation of pregnancy: Secondary | ICD-10-CM

## 2018-07-16 DIAGNOSIS — Z3480 Encounter for supervision of other normal pregnancy, unspecified trimester: Secondary | ICD-10-CM

## 2018-07-16 DIAGNOSIS — Z3483 Encounter for supervision of other normal pregnancy, third trimester: Secondary | ICD-10-CM

## 2018-07-16 NOTE — Progress Notes (Signed)
   PRENATAL VISIT NOTE  Subjective:  Kim Blair is a 28 y.o. G2P1001 at [redacted]w[redacted]d being seen today for ongoing prenatal care.  She is currently monitored for the following issues for this low-risk pregnancy and has Supervision of other normal pregnancy, antepartum; Sciatica of left side; and Language barrier on their problem list.  Patient reports backache.  Contractions: Not present. Vag. Bleeding: None.  Movement: Present. Denies leaking of fluid.   The following portions of the patient's history were reviewed and updated as appropriate: allergies, current medications, past family history, past medical history, past social history, past surgical history and problem list. Problem list updated.  Objective:   Vitals:   07/16/18 1320  BP: 124/83  Pulse: 84  Weight: 200 lb 4.8 oz (90.9 kg)    Fetal Status: Fetal Heart Rate (bpm): 143 Fundal Height: 34 cm Movement: Present     General:  Alert, oriented and cooperative. Patient is in no acute distress.  Skin: Skin is warm and dry. No rash noted.   Cardiovascular: Normal heart rate noted  Respiratory: Normal respiratory effort, no problems with respiration noted  Abdomen: Soft, gravid, appropriate for gestational age.  Pain/Pressure: Present     Pelvic: Cervical exam deferred        Extremities: Normal range of motion.  Edema: None  Mental Status: Normal mood and affect. Normal behavior. Normal judgment and thought content.   Assessment and Plan:  Pregnancy: G2P1001 at [redacted]w[redacted]d  1. Supervision of other normal pregnancy, antepartum - Doing well, some hip pain, no contractions, bleeding or LOF  Preterm labor symptoms and general obstetric precautions including but not limited to vaginal bleeding, contractions, leaking of fluid and fetal movement were reviewed in detail with the patient. Please refer to After Visit Summary for other counseling recommendations.  Return in about 2 weeks (around 07/30/2018) for LOB.   Vonzella Nipple,  PA-C

## 2018-07-16 NOTE — Patient Instructions (Signed)
Evaluación de los movimientos fetales °Fetal Movement Counts °Introducción °Nombre del paciente: ________________________________________________ Fecha de parto estimada: ____________________ °¿Qué es una evaluación de los movimientos fetales? ° °Una evaluación de los movimientos fetales es el registro del número de veces que siente que el bebé se mueve durante un cierto período de tiempo. Esto también se puede denominar recuento de patadas fetales. Una evaluación de movimientos fetales se recomienda a todas las embarazadas. Es posible que le indiquen que comience a evaluar los movimientos fetales desde la semana 28 de embarazo. °Preste atención cuando sienta que el bebé está más activo. Podrá detectar los ciclos en que el bebé duerme y está despierto. También podrá detectar que ciertas cosas hacen que su bebé se mueva más. Deberá realizar una evaluación de los movimientos fetales en las siguientes situaciones: °· Cuando el bebé está más activo habitualmente. °· A la misma hora, todos los días. °Un buen momento para evaluar los movimientos fetales es cuando está descansando, después de haber comido y bebido algo. °¿Cómo debo contar los movimientos fetales? °1. Encuentre un lugar tranquilo y cómodo. Siéntese o acuéstese de lado. °2. Anote la fecha, la hora de inicio y de finalización y la cantidad de movimientos que sintió entre esas dos horas. Lleve esta información a las visitas de control. °3. Cuente las pataditas, revoloteos, chasquidos, vueltas o pinchazos en un período de 2 horas. Debe sentir al menos 10 movimientos en 2 horas. °4. Cuando sienta 10 movimientos, puede dejar de contar. °5. Si no siente 10 movimientos en 2 horas, coma y beba algo. Luego, continúe descansando y contando durante 1 hora. Si siente al menos 4 movimientos durante esa hora, puede dejar de contar. °Comuníquese con un médico si: °· Siente menos de 4 movimientos en 2 horas. °· El bebé no se mueve tanto como suele hacerlo. °Fecha:  ____________ Hora de inicio: ____________ Hora de finalización: ____________ Movimientos: ____________ °Fecha: ____________ Hora de inicio: ____________ Hora de finalización: ____________ Movimientos: ____________ °Fecha: ____________ Hora de inicio: ____________ Hora de finalización: ____________ Movimientos: ____________ °Fecha: ____________ Hora de inicio: ____________ Hora de finalización: ____________ Movimientos: ____________ °Fecha: ____________ Hora de inicio: ____________ Hora de finalización: ____________ Movimientos: ____________ °Fecha: ____________ Hora de inicio: ____________ Hora de finalización: ____________ Movimientos: ____________ °Fecha: ____________ Hora de inicio: ____________ Hora de finalización: ____________ Movimientos: ____________ °Fecha: ____________ Hora de inicio: ____________ Hora de finalización: ____________ Movimientos: ____________ °Fecha: ____________ Hora de inicio: ____________ Hora de finalización: ____________ Movimientos: ____________ °Esta información no tiene como fin reemplazar el consejo del médico. Asegúrese de hacerle al médico cualquier pregunta que tenga. °Document Released: 09/19/2007 Document Revised: 09/15/2016 Document Reviewed: 07/22/2015 °Elsevier Interactive Patient Education © 2019 Elsevier Inc. °Contracciones de Braxton Hicks °Braxton Hicks Contractions °Las contracciones del útero pueden presentarse durante todo el embarazo, pero no siempre indican que la mujer está de parto. Es posible que usted haya tenido contracciones de práctica llamadas "contracciones de Braxton Hicks". A veces, se las confunde con el parto real. °¿Qué son las contracciones de Braxton Hicks? °Las contracciones de Braxton Hicks son espasmos que se producen en los músculos del útero antes del parto. A diferencia de las contracciones del parto verdadero, estas no producen el agrandamiento (la dilatación) ni el afinamiento del cuello uterino. Hacia el final del embarazo (entre las semanas  32 y 34), las contracciones de Braxton Hicks pueden presentarse más seguido y tornarse más intensas. A veces, resulta difícil distinguirlas del parto verdadero porque pueden ser muy molestas. No debe sentirse avergonzada si concurre al hospital con falso parto. °En ocasiones, la única forma de saber si el   el trabajo de parto es verdadero es que el mdico determine si hay cambios en el cuello del tero. El Office Depotmdico le har un examen fsico y International aid/development workerquizs le controle las contracciones. Si usted no est de Systems developerparto verdadero, el examen debe indicar que el cuello uterino no est dilatado y que usted no ha roto [place]. Si no hay otros problemas de salud asociados con su embarazo, no habr inconvenientes si la envan a su casa con un falso parto. Es posible que las contracciones de Braxton Hicks continen hasta que se desencadene el parto verdadero. Cmo diferenciar el Aleen Campitrabajo de parto falso del verdadero Trabajo de parto verdadero  Las contracciones duran de [id number].  Las contracciones pueden tornarse muy regulares.  La molestia generalmente se siente en la parte superior del tero y se extiende hacia la zona baja del abdomen y Parker Hannifinhacia la cintura.  Las contracciones no desaparecen cuando usted camina.  Las contracciones generalmente se hacen ms intensas y Comptrolleraumentan en frecuencia.  El cuello uterino se dilata y se afina. Parto falso  En general, las contracciones son ms cortas y no tan intensas como las del parto verdadero.  En general, las contracciones son irregulares.  A menudo, las contracciones se sienten en la parte delantera de la parte baja del abdomen y en la ingle.  Las Futures tradercontracciones pueden desaparecer cuando usted camina o [place] de posicin mientras est [place] Islandacostada.  Las contracciones se vuelven ms dbiles y su duracin es menor a medida que transcurre Allied Waste Industriesel tiempo.  En general, el cuello uterino no se dilata ni se afina. Siga estas indicaciones en su casa:   Tome los medicamentos  de venta libre y los recetados solamente como se lo haya indicado el mdico.  Contine haciendo los ejercicios habituales y siga las dems indicaciones que el mdico le d.  Coma y beba con moderacin si cree que est de parto.  Si las contracciones de Dole FoodBraxton Hicks le provocan incomodidad: ? Cambie de posicin: si est acostada o descansando, camine; si est caminando, descanse. ? Sintese y descanse en una baera con agua tibia. ? Beba suficiente lquido como para mantener la orina de color amarillo plido. La deshidratacin puede provocar contracciones. ? Respire lenta y profundamente varias veces por hora.  Vaya a todas las visitas de control prenatales y de control como se lo haya indicado el mdico. Esto es importante. Comunquese con un mdico si:  Tiene fiebre.  Siente dolor constante en el abdomen. Solicite ayuda de inmediato si:  Las contracciones se intensifican, se hacen ms regulares y Arboriculturistcercanas entre s.  Tiene una prdida de lquido por la vagina.  Elimina una mucosidad sanguinolenta (prdida del tapn mucoso).  Tiene una hemorragia vaginal.  Tiene un dolor en la zona lumbar que nunca tuvo antes.  Siente que la cabeza del beb empuja hacia abajo y ejerce presin en la zona plvica.  El beb no se mueve tanto como antes. Resumen  Las State Farmcontracciones que se presentan antes del parto se conocen como contracciones de [place] ViewBraxton Hicks, [place]falso parto o contracciones de Multimedia programmerprctica.  En general, las contracciones de [address] Pine StreetBraxton Hicks son ms cortas, ms dbiles, con ms tiempo entre una y Fruitportotra, y menos regulares que las contracciones del parto verdadero. Las contracciones del parto verdadero se intensifican progresivamente y se tornan regulares y ms frecuentes.  Para controlar la Longs Drug Storesmolestia que producen las contracciones de [place]Braxton Hicks, puede cambiar de posicin, darse un bao templado y Lawyerdescansar, beber mucha agua o practicar la respiracin profunda. Esta informacin no  tiene Public house manager el consejo del mdico. Asegrese de hacerle al mdico cualquier pregunta que tenga. Document Released: 01/22/2017 Document Revised: 06/04/2017 Document Reviewed: 01/22/2017 Elsevier Interactive Patient Education  2019 [employer].

## 2018-07-30 ENCOUNTER — Ambulatory Visit (INDEPENDENT_AMBULATORY_CARE_PROVIDER_SITE_OTHER): Payer: Self-pay | Admitting: Student

## 2018-07-30 VITALS — BP 107/65 | HR 76 | Wt 200.6 lb

## 2018-07-30 DIAGNOSIS — Z3483 Encounter for supervision of other normal pregnancy, third trimester: Secondary | ICD-10-CM

## 2018-07-30 DIAGNOSIS — Z3480 Encounter for supervision of other normal pregnancy, unspecified trimester: Secondary | ICD-10-CM

## 2018-07-30 DIAGNOSIS — Z113 Encounter for screening for infections with a predominantly sexual mode of transmission: Secondary | ICD-10-CM

## 2018-07-30 NOTE — Progress Notes (Signed)
   PRENATAL VISIT NOTE  Subjective:  Kim Blair is a 28 y.o. G2P1001 at 7843w0d being seen today for ongoing prenatal care.  She is currently monitored for the following issues for this low-risk pregnancy and has Supervision of other normal pregnancy, antepartum; Sciatica of left side; and Language barrier on their problem list.  Patient reports no complaints.  Contractions: Not present. Vag. Bleeding: None.  Movement: Present. Denies leaking of fluid.   The following portions of the patient's history were reviewed and updated as appropriate: allergies, current medications, past family history, past medical history, past social history, past surgical history and problem list. Problem list updated.  Objective:   Vitals:   07/30/18 1058  BP: 107/65  Pulse: 76  Weight: 200 lb 9.6 oz (91 kg)    Fetal Status: Fetal Heart Rate (bpm): 142 Fundal Height: 36 cm Movement: Present  Presentation: Vertex  General:  Alert, oriented and cooperative. Patient is in no acute distress.  Skin: Skin is warm and dry. No rash noted.   Cardiovascular: Normal heart rate noted  Respiratory: Normal respiratory effort, no problems with respiration noted  Abdomen: Soft, gravid, appropriate for gestational age.  Pain/Pressure: Present     Pelvic: Cervical exam performed        Extremities: Normal range of motion.  Edema: None  Mental Status: Normal mood and affect. Normal behavior. Normal judgment and thought content.   Assessment and Plan:  Pregnancy: G2P1001 at 7143w0d  1. Supervision of other normal pregnancy, antepartum -doing well; reviewed that patient wants paraguard - Culture, beta strep (group b only) - GC[facility]Chlamydia probe amp [facility]  Preterm labor symptoms and general obstetric precautions including but not limited to vaginal bleeding, contractions, leaking of fluid and fetal movement were reviewed in detail with the patient. Please refer to After Visit Summary for other  counseling recommendations.  Return in about 1 week (around 08/06/2018), or LROB.  No future appointments.  Marylene LandKathryn Lorraine Ottilie Wigglesworth, CNM

## 2018-07-30 NOTE — Patient Instructions (Signed)
Estudios y Engineer, manufacturingpruebas de deteccin sistemticas durante Firefighterel embarazo Tests and Screening During Pregnancy Realizarse ciertos estudios y Engineer, manufacturingpruebas de deteccin sistemticas durante el Psychiatristembarazo es una parte importante del cuidado prenatal. Estos estudios ayudan al mdico a Engineer, manufacturingdetectar problemas que podran Orthoptistafectar su embarazo. Algunos estudios se realizan en todas las mujeres embarazadas y otros son opcionales. La mayora de los estudios y pruebas de deteccin sistemticas no representan ningn riesgo para usted ni para su beb. Es posible que necesite estudios adicionales si Jerseyalguna de las pruebas de rutina indica que existe un problema. Estudios y Engineer, manufacturingpruebas de deteccin sistemticas realizados al inicio del embarazo Algunos estudios y pruebas de deteccin sistemticas que puede esperar que le realicen al principio del embarazo incluyen:  Anlisis de Kayceesangre, tales como: ? Hemograma completo (HC). Este anlisis se realiza para controlar los glbulos blancos y rojos. Puede ayudar a identificar un riesgo de anemia, una infeccin o sangrado. ? Determinacin del grupo sanguneo. Este anlisis determina el grupo sanguneo, as como si tiene una determinada protena en los glbulos rojos (factor Rh). Si no tiene esta protena (Rh negativo) y su beb s la tuviera (Rh positivo), su cuerpo podra producir anticuerpos contra el factor Rh. Esto podra ser peligroso para la salud del beb. ? Estudios para Hotel managerdetectar enfermedades que pueden causar defectos congnitos o que pueden transmitirse al beb, tales como:  Rubola. La prueba indica si es inmune a la rubola.  Hepatitis B y C. A todas las mujeres se les realiza el anlisis para detectar el virus de la hepatitis B. Es posible que tambin se le realice al anlisis de la hepatitis C si tiene factores de riesgo de Surveyor, quantitypadecer la afeccin.  Infeccin por el virus de Zika. Es posible que le hagan un anlisis de sangre o de orina para Engineer, manufacturingdetectar la presencia de esta infeccin si usted  o su pareja ha viajado a una zona donde existe virus.  Anlisis de Comorosorina. Lauris PoagUna muestra de orina se puede analizar para detectar diabetes, protenas en la orina y signos de infeccin.  Anlisis para detectar infecciones de transmisin sexual (ITS), como VIH, sfilis y clamidia.  Anlisis de tuberculosis. Es posible que le hagan esta prueba cutnea si est en riesgo de tener tuberculosis.  Ecografa fetal. Es un estudio de diagnstico por imgenes del beb en gestacin. Para realizarlo, se usan ondas de sonido y [place]a computadora. Este estudio puede realizarse entre las 11 y 14 semanas para confirmar el Psychiatristembarazo y Contractorayudar a Development worker, communitydeterminar la fecha de [place] Mountainparto. Estudios y Engineer, manufacturingpruebas de deteccin sistemticas realizados al Special educational needs teacheravanzar el embarazo Ciertos estudios se realizan por primera vez cuando el embarazo ya est ms avanzado. Adems, se repiten algunos de los Fluor Corporationestudios que se hicieron al inicio del Psychiatristembarazo. Algunos de los estudios habituales que puede esperar ms adelante en el embarazo incluyen:  Anlisis de anticuerpos Rh. Si usted tiene sangre Rh negativo, le harn un anlisis de sangre aproximadamente a las 28 semanas de embarazo para ver si est produciendo anticuerpos Rh. Si no ha comenzado a producir anticuerpos, Theatre stage managerle aplicarn una inyeccin para evitar que produzca anticuerpos durante el resto del [place]enburgembarazo.  Anlisis de glucosa. Este anlisis mide el nivel de azcar en la sangre para averiguar si est desarrollando el tipo de diabetes que se produce durante el embarazo (diabetes gestacional). Pueden hacerle este anlisis antes si tiene factores de riesgo de tener diabetes.  Anlisis de deteccin de estreptococos del grupo B (EGB). Los EGB son un tipo de bacterias que pueden vivir en el  recto o la vagina. Es posible tener EGB y no presentar ningn sntoma. Los EGB pueden contagiarse al beb durante el parto. Este anlisis consiste en realizar un hisopado rectal y vaginal cuando transcurrieron entre 35 y 37  semanas de Psychiatristembarazo. Si el resultado del anlisis es positivo para el EGB, puede tratarse con antibiticos.  Hemograma completo para detectar la presencia de anemia y capacidad de coagulacin.  Anlisis de orina para Landscape architectdetectar la presencia de protena, que puede ser un signo de una afeccin llamada preeclampsia.  Ecografa fetal. Esta puede repetirse AutoNationentre las semanas 16 y 20 del embarazo para comprobar cmo crece y se desarrolla el beb. Estudios de deteccin de defectos congnitos Algunos defectos congnitos pueden deberse a la presencia de genes anormales que son hereditarios. Al comienzo del Psychiatristembarazo, pueden hacerse estudios para determinar si el beb est en riesgo de tener un trastorno gentico. Estos estudios son opcionales. El tipo de estudios recomendados para usted depender de sus antecedentes clnicos y familiares, su origen tnico y su edad. Los estudios pueden incluir lo siguiente:  Pruebas de deteccin sistemticas. Estas pruebas pueden incluir [place]una ecografa, anlisis de Guntownsangre o [place]a combinacin de Carlisleambos. Los anlisis de sangre se realizan para Landscape architectdetectar la presencia de genes anormales y la ecografa se realiza para Engineer, manufacturingdetectar defectos congnitos de manera temprana.  Prueba de deteccin de portadores. Este estudio implica analizar muestras de sangre o de saliva de ambos padres para determinar si son portadores de genes anormales que podran transmitir al beb. Si las pruebas de deteccin sistemticas genticas revelan que el beb est en riesgo de tener un defecto gentico, posiblemente se recomienden pruebas de diagnstico adicionales, por ejemplo:  Amniocentesis. Implica analizar Lauris Poaguna muestra de lquido del tero (lquido amnitico).  Muestras de vellosidades corinicas. En Regions Financial Corporationeste estudio, se analiza Colombiauna muestra de clulas de la placenta para detectar si hay clulas anormales. A diferencia de otros estudios que se realizan durante el Houstonembarazo, las pruebas de diagnstico s conllevan  cierto riesgo para Firefighterel embarazo. Hable con su mdico Fortune Brandssobre los riesgos y beneficios de los estudios genticos. Dnde encontrar ms informacin  American Pregnancy Association (Asociacin Estadounidense del ConejoEmbarazo): americanpregnancy.org/prenatal-testing  Office on Pitney BowesWomen's Health (Oficina para la Salud de la Mujer): MightyReward.co.nzwomenshealth.gov/pregnancy  March of Dimes: marchofdimes.org/pregnancy Preguntas para hacerle al mdico  Qu estudios de rutina son recomendables para m?  Cundo y de qu modo se realizarn estos estudios?  Cundo [place] Foodsobtendr los resultados de los estudios de rutina?  Qu significan los resultados de estos estudios para m o mi beb?  Recomienda alguna prueba de deteccin sistemtica gentica? Cul o cules?  Debo consultar a un asesor gentico antes de someterme a una prueba de deteccin gentica? Resumen  Realizarse estudios y pruebas de deteccin sistemticas durante el embarazo es una parte importante del cuidado prenatal.  Al principio del Psychiatristembarazo, pueden realizarse estudios para verificar el grupo sanguneo y Maywoodestado de anticuerpos Rh, adems de los riesgos de diversos trastornos que pueden afectar al beb.  La ecografa fetal puede realizarse a comienzos del embarazo para Production assistant, radioconfirmar el embarazo, y ms adelante para Engineer, manufacturingdetectar la presencia de cualquier defecto congnito.  Cuando el embarazo est ms avanzado, pueden incluirse anlisis de deteccin de estreptococos del grupo B (EGB) y de diabetes gestacional.  Los estudios genticos son opcionales. Considere la posibilidad de Science writerconsultar a un asesor gentico acerca de CHS Incestos estudios. Esta informacin no tiene Theme park managercomo fin reemplazar el consejo del mdico. Asegrese de hacerle al mdico cualquier pregunta que tenga. Document Released: 10/17/2017  Document Revised: 10/17/2017 Document Reviewed: 10/17/2017 Elsevier Interactive Patient Education  [employer].

## 2018-07-31 LAB — GC/CHLAMYDIA PROBE AMP (~~LOC~~) NOT AT ARMC
CHLAMYDIA, DNA PROBE: NEGATIVE
Neisseria Gonorrhea: NEGATIVE

## 2018-08-02 LAB — CULTURE, BETA STREP (GROUP B ONLY): Strep Gp B Culture: POSITIVE — AB

## 2018-08-07 ENCOUNTER — Other Ambulatory Visit: Payer: Self-pay

## 2018-08-07 ENCOUNTER — Ambulatory Visit (INDEPENDENT_AMBULATORY_CARE_PROVIDER_SITE_OTHER): Payer: Self-pay | Admitting: Medical

## 2018-08-07 VITALS — BP 111/76 | HR 85 | Wt 204.2 lb

## 2018-08-07 DIAGNOSIS — Z3480 Encounter for supervision of other normal pregnancy, unspecified trimester: Secondary | ICD-10-CM

## 2018-08-07 DIAGNOSIS — Z3483 Encounter for supervision of other normal pregnancy, third trimester: Secondary | ICD-10-CM

## 2018-08-07 NOTE — Progress Notes (Signed)
   PRENATAL VISIT NOTE  Subjective:  Kim Blair is a 28 y.o. G2P1001 at [redacted]w[redacted]d being seen today for ongoing prenatal care.  She is currently monitored for the following issues for this low-risk pregnancy and has Supervision of other normal pregnancy, antepartum; Sciatica of left side; and Language barrier on their problem list.  Patient reports pelvic pain.  Contractions: Not present. Vag. Bleeding: None.  Movement: Present. Denies leaking of fluid.   The following portions of the patient's history were reviewed and updated as appropriate: allergies, current medications, past family history, past medical history, past social history, past surgical history and problem list. Problem list updated.  Objective:   Vitals:   08/07/18 1527  BP: 111/76  Pulse: 85  Weight: 204 lb 3.2 oz (92.6 kg)    Fetal Status: Fetal Heart Rate (bpm): 149 Fundal Height: 37 cm Movement: Present     General:  Alert, oriented and cooperative. Patient is in no acute distress.  Skin: Skin is warm and dry. No rash noted.   Cardiovascular: Normal heart rate noted  Respiratory: Normal respiratory effort, no problems with respiration noted  Abdomen: Soft, gravid, appropriate for gestational age.  Pain/Pressure: Present     Pelvic: Cervical exam deferred        Extremities: Normal range of motion.  Edema: Trace  Mental Status: Normal mood and affect. Normal behavior. Normal judgment and thought content.   Assessment and Plan:  Pregnancy: G2P1001 at [redacted]w[redacted]d  1. Supervision of other normal pregnancy, antepartum - GBS positive, discussed treatment in labor  Term labor symptoms and general obstetric precautions including but not limited to vaginal bleeding, contractions, leaking of fluid and fetal movement were reviewed in detail with the patient. Please refer to After Visit Summary for other counseling recommendations.  Return in about 1 week (around 08/14/2018) for LOB.  Vonzella Nipple, PA-C

## 2018-08-07 NOTE — Patient Instructions (Addendum)
Evaluacin de los movimientos fetales Fetal Movement Counts Introduccin Nombre del paciente: ________________________________________________ Kim Blair estimada: ____________________ [name] evaluacin de los movimientos fetales?  Una evaluacin de los movimientos fetales es el registro del nmero de veces que siente que el beb se mueve durante un cierto perodo de [place]. Esto tambin se puede denominar recuento de patadas fetales. Una evaluacin de movimientos fetales se recomienda a todas las embarazadas. Es posible que le indiquen que comience a Development worker, community los movimientos fetales desde la semana 28 de [place]. Preste atencin cuando sienta que el beb est ms activo. Podr detectar los ciclos en que el beb duerme y est despierto. Tambin podr detectar que ciertas cosas hacen que su beb se mueva ms. Deber realizar una evaluacin de los movimientos fetales en las siguientes situaciones:  Cuando el beb est ms activo habitualmente.  A la [employer], todos los [place]. Un buen momento para evaluar los movimientos fetales es cuando est descansando, despus de haber comido y bebido algo. Cmo debo contar los movimientos fetales? 1. Encuentre un lugar tranquilo y cmodo. Sintese o acustese de lado. 2. Anote la fecha, la hora de inicio y de finalizacin y la cantidad de movimientos que sinti entre esas dos horas. Lleve esta informacin a las visitas de control. 3. Cuente las pataditas, revoloteos, chasquidos, vueltas o pinchazos en un perodo de 2horas. Debe sentir al menos en 2horas. 4. Cuando sienta , puede dejar de contar. 5. Si no siente en 2horas, coma y beba algo. Luego, contine descansando y Industrial/product designer. Si siente al menos durante esa hora, puede dejar de contar. Comunquese con un mdico si:  Siente menos de en 2horas.  El beb no se mueve tanto como suele hacerlo. Fecha:  ____________ [name] inicio: ____________ [name] finalizacin: ____________ Movimientos: ____________ [name]: ____________ [name] inicio: ____________ [name] finalizacin: ____________ Movimientos: ____________ [name]: ____________ [name] inicio: ____________ [name] finalizacin: ____________ Movimientos: ____________ [name]: ____________ [name] inicio: ____________ [name] finalizacin: ____________ Movimientos: ____________ [name]: ____________ [name] inicio: ____________ [name] de finalizacin: ____________ Movimientos: ____________ [name]: ____________ [name] inicio: ____________ [name] de finalizacin: ____________ Movimientos: ____________ [name]: ____________ [name] inicio: ____________ [name] de finalizacin: ____________ Movimientos: ____________ [name]: ____________ [name] inicio: ____________ [name] finalizacin: ____________ Movimientos: ____________ [name]: ____________ [name] inicio: ____________ [name] de finalizacin: ____________ Movimientos: ____________ Esta informacin no tiene como fin reemplazar el consejo del mdico. Asegrese de hacerle al mdico cualquier pregunta que tenga. Document Released: 09/19/2007 Document Revised: 09/15/2016 Document Reviewed: 07/22/2015 Elsevier Interactive Patient Education  2019 [employer]. Signos y sntomas del [place] de parto Signs and Symptoms of Labor El [place] de parto es el proceso natural del cuerpo por el cual se saca al beb, la placenta y el cordn umbilical del tero. Por lo general, el proceso del [place] de parto comienza cuando el embarazo ha llegado a su trmino, entre 37 y 40 semanas de Psychiatrist. Cmo sabr que estoy prxima a comenzar el trabajo de parto? A medida que el cuerpo se prepara para el trabajo de parto y el nacimiento del beb, puede notar los siguientes sntomas en las semanas y [place] anteriores al trabajo de parto propiamente dicho:  Un fuerte deseo de preparar su casa para recibir al nuevo beb. Esto se denomina  anidacin. La anidacin puede ser un signo de que se est acercando el [place] de [place], y puede ocurrir varias semanas antes del nacimiento. La anidacin puede implicar  limpiar y organizar la casa. °· Una pequeña cantidad de mucosidad espesa y con sangre sale de la vagina (aparición normal de sangre o pérdida del tapón mucoso). Esto puede suceder más de una semana antes de que comience el trabajo de parto, o puede ocurrir justo antes de que comience el trabajo de parto a medida que el cuello uterino comienza a ensancharse (dilatarse). En algunas mujeres, el tapón mucoso sale entero de una sola vez. En otras, pueden salir pequeñas partes del tapón mucoso de forma gradual durante varios días. °· El bebé se mueve (desciende) a la parte inferior de la pelvis para ponerse en posición para el nacimiento (aligeramiento). Cuando esto sucede, puede sentir más presión en la vejiga y el hueso pélvico, y menos presión en las costillas. Esto facilitará la respiración. También puede hacer que necesite orinar con más frecuencia y que tenga problemas para hacer de vientre. °· Tener "contracciones de práctica" (contracciones de Braxton Hicks) que ocurren a intervalos irregulares (espaciadas de modo desigual) con una diferencia de más de 10 minutos. Esto también se denomina trabajo de parto falso. Las contracciones de trabajo de parto falso son comunes luego del ejercicio o la actividad sexual, y se detendrán si cambia de posición, descansa o toma líquidos. Estas contracciones son generalmente leves y no se tornan más fuertes con el tiempo. Pueden sentirse como lo siguiente: °? Un dolor de espalda. °? Calambres leves, similares a los dolores menstruales. °? Tirantez o presión en el abdomen. °Otros síntomas tempranos de que el trabajo de parto puede comenzar pronto incluyen: °· Náuseas o pérdida del apetito. °· Diarrea. °· Un repentino estallido de energía o sentirse muy cansada. °· Cambios en el humor. °· Problemas para  dormir. °¿Cómo sabré cuando ha comenzado el trabajo de parto? °Los signos de que ha comenzado el trabajo de parto verdadero pueden incluir: °· Contracciones a intervalos regulares (espaciadas de modo regular) que se incrementan en intensidad. Esto puede sentirse como presión o estrechamiento intenso en el abdomen, que se desplaza hacia la espalda. °? Las contracciones pueden sentirse también como dolor rítmico en la parte superior de los muslos y la espalda que va y viene a intervalos regulares. °? Para las madres primerizas, este cambio en intensidad de las contracciones ocurre generalmente a un ritmo más gradual. °? Las mujeres que ya han sido madres pueden notar una progresión más rápida de los cambios de las contracciones. °· Una sensación de presión en el área vaginal. °· Ruptura de la bolsa (ruptura de las membranas). Es cuando el saco de líquido que rodea al bebé se rompe. Cuando esto sucede, notará que le sale líquido de la vagina. Este puede ser claro o estar manchado de sangre. Generalmente el trabajo de parto comienza 24 horas después de la ruptura de bolsa, pero puede tomar más tiempo en comenzar. °? Algunas mujeres notan esto como un chorro de líquido. °? Otras notan que la ropa interior se moja repetidas veces. °Siga estas indicaciones en su casa: ° °· Cuando comience el trabajo de parto o si rompe bolsa, llame al médico o a la línea de atención de enfermería. Ellos, en función de su situación, determinarán cuándo debe ir a hacerse un examen. °· Si entra en trabajo de parto temprano, es posible que pueda descansar y manejar los síntomas en su casa. Algunas estrategias para probar en su casa incluyen: °? Técnicas de respiración y relajación. °? Tomar una ducha o un baño de inmersión tibios. °? Escuchar música. °? Usar una almohadilla térmica   en la espalda para Engineer, materialsaliviar el dolor. Si se le indica que use calor:  Coloque una toalla entre la piel y la fuente de Airline pilotcalor.  Aplique el calor durante 20 a  30minutos.  Retire la fuente de calor si la piel se pone de color rojo brillante. Esto es muy importante si no puede Financial risk analystsentir dolor, calor o fro. Puede correr un riesgo mayor de sufrir quemaduras. Solicite ayuda de inmediato si:  Tiene contracciones dolorosas y regulares cada 5 minutos o menos.  El trabajo de parto comienza antes de que se cumplan las 37 semanas de Danteembarazo.  Tiene fiebre.  Siente un dolor de cabeza intenso que no se [place] Hillsalivia.  Elimina cogulos de sangre de color rojo brillante por la vagina.  No siente que el beb se mueva.  Experimenta la aparicin repentina de: ? Dolor de cabeza intenso con problemas de la visin. ? Nuseas, vmitos o diarrea. ? Dolor en el pecho o falta de aire. Estos sntomas pueden Customer service managerindicar una emergencia. Si el mdico recomienda que vaya al hospital o al centro de nacimientos donde va a dar a luz, no conduzca usted misma. Pdale a otra persona que conduzca o llame a los servicios de Associate Professoremergencia (911 en los Estados [place]idos) Resumen  El trabajo de parto es el proceso natural del cuerpo por el cual se saca al beb, la placenta y el cordn umbilical del tero.  Por lo general, el proceso del Uniontrabajo de parto comienza cuando el embarazo ha llegado a su trmino, entre 37 y 40 semanas de Psychiatristembarazo.  Cuando comience el trabajo de parto o si rompe bolsa, llame al mdico o a la lnea de atencin de enfermera. Ellos, en funcin de su situacin, determinarn cundo debe ir a Location managerhacerse un examen. Esta informacin no tiene Theme park managercomo fin reemplazar el consejo del mdico. Asegrese de hacerle al mdico cualquier pregunta que tenga. Document Released: 06/27/2015 Document Revised: 03/07/2017 Document Reviewed: 03/07/2017 Elsevier Interactive Patient Education  [employer]2019 Elsevier Inc.

## 2018-08-15 ENCOUNTER — Ambulatory Visit (INDEPENDENT_AMBULATORY_CARE_PROVIDER_SITE_OTHER): Payer: Self-pay | Admitting: Obstetrics and Gynecology

## 2018-08-15 VITALS — BP 120/75 | HR 68 | Wt 203.6 lb

## 2018-08-15 DIAGNOSIS — O99820 Streptococcus B carrier state complicating pregnancy: Secondary | ICD-10-CM

## 2018-08-15 DIAGNOSIS — Z3483 Encounter for supervision of other normal pregnancy, third trimester: Secondary | ICD-10-CM

## 2018-08-15 DIAGNOSIS — Z3A38 38 weeks gestation of pregnancy: Secondary | ICD-10-CM

## 2018-08-15 DIAGNOSIS — Z3480 Encounter for supervision of other normal pregnancy, unspecified trimester: Secondary | ICD-10-CM

## 2018-08-15 NOTE — Progress Notes (Signed)
   PRENATAL VISIT NOTE  Subjective:  Kim Blair is a 28 y.o. G2P1001 at [redacted]w[redacted]d being seen today for ongoing prenatal care.  She is currently monitored for the following issues for this low-risk pregnancy and has Supervision of other normal pregnancy, antepartum; Sciatica of left side; Language barrier; and GBS (group B Streptococcus carrier), +RV culture, currently pregnant on their problem list.  Patient reports no complaints.  Contractions: Not present. Vag. Bleeding: None.  Movement: Present. Denies leaking of fluid.   The following portions of the patient's history were reviewed and updated as appropriate: allergies, current medications, past family history, past medical history, past social history, past surgical history and problem list. Problem list updated.  Objective:   Vitals:   08/15/18 1600  BP: 120/75  Pulse: 68  Weight: 203 lb 9.6 oz (92.4 kg)    Fetal Status: Fetal Heart Rate (bpm): 133 Fundal Height: 38 cm Movement: Present     General:  Alert, oriented and cooperative. Patient is in no acute distress.  Skin: Skin is warm and dry. No rash noted.   Cardiovascular: Normal heart rate noted  Respiratory: Normal respiratory effort, no problems with respiration noted  Abdomen: Soft, gravid, appropriate for gestational age.  Pain/Pressure: Present     Pelvic: Cervical exam deferred        Extremities: Normal range of motion.  Edema: Trace  Mental Status: Normal mood and affect. Normal behavior. Normal judgment and thought content.   Assessment and Plan:  Pregnancy: G2P1001 at [redacted]w[redacted]d  1. GBS (group B Streptococcus carrier), +RV culture, currently pregnant  Discussed results   2. Supervision of other normal pregnancy, antepartum - Discussed new hospital; directions given  - Will schedule BPP/NST at next visit for [redacted]w[redacted]d  There are no diagnoses linked to this encounter. Term labor symptoms and general obstetric precautions including but not limited to vaginal  bleeding, contractions, leaking of fluid and fetal movement were reviewed in detail with the patient. Please refer to After Visit Summary for other counseling recommendations.  Return in about 1 week (around 08/22/2018).  Future Appointments  Date Time Provider Department Center  08/21/2018  3:15 PM Kathlene Cote [facility]    Venia Carbon, NP

## 2018-08-15 NOTE — Patient Instructions (Addendum)
[facility] at [facility] [address] [place],  [place]  [address] Main: [phone] 

## 2018-08-21 ENCOUNTER — Other Ambulatory Visit: Payer: Self-pay

## 2018-08-21 ENCOUNTER — Ambulatory Visit (INDEPENDENT_AMBULATORY_CARE_PROVIDER_SITE_OTHER): Payer: Self-pay | Admitting: Medical

## 2018-08-21 ENCOUNTER — Encounter: Payer: Self-pay | Admitting: Medical

## 2018-08-21 VITALS — BP 123/76 | HR 83 | Wt 206.9 lb

## 2018-08-21 DIAGNOSIS — Z3483 Encounter for supervision of other normal pregnancy, third trimester: Secondary | ICD-10-CM

## 2018-08-21 DIAGNOSIS — Z3480 Encounter for supervision of other normal pregnancy, unspecified trimester: Secondary | ICD-10-CM

## 2018-08-21 DIAGNOSIS — Z3A39 39 weeks gestation of pregnancy: Secondary | ICD-10-CM

## 2018-08-21 DIAGNOSIS — O99820 Streptococcus B carrier state complicating pregnancy: Secondary | ICD-10-CM

## 2018-08-21 NOTE — Patient Instructions (Signed)
Evaluacin de los movimientos fetales Fetal Movement Counts Introduccin Nombre del paciente: ________________________________________________ Kim Blair estimada: ____________________ Kim Blair evaluacin de los movimientos fetales?  Una evaluacin de los movimientos fetales es el registro del nmero de veces que siente que el beb se mueve durante un cierto perodo de [place]. Esto tambin se puede denominar recuento de patadas fetales. Una evaluacin de movimientos fetales se recomienda a todas las embarazadas. Es posible que le indiquen que comience a Development worker, community los movimientos fetales desde la semana 28 de [place]. Preste atencin cuando sienta que el beb est ms activo. Podr detectar los ciclos en que el beb duerme y est despierto. Tambin podr detectar que ciertas cosas hacen que su beb se mueva ms. Deber realizar una evaluacin de los movimientos fetales en las siguientes situaciones:  Cuando el beb est ms activo habitualmente.  A la [employer], todos los [place]. Un buen momento para evaluar los movimientos fetales es cuando est descansando, despus de haber comido y bebido algo. Cmo debo contar los movimientos fetales? 1. Encuentre un lugar tranquilo y cmodo. Sintese o acustese de lado. 2. Anote la fecha, la hora de inicio y de finalizacin y la cantidad de movimientos que sinti entre esas dos horas. Lleve esta informacin a las visitas de control. 3. Cuente las pataditas, revoloteos, chasquidos, vueltas o pinchazos en un perodo de 2horas. Debe sentir al menos en 2horas. 4. Cuando sienta , puede dejar de contar. 5. Si no siente en 2horas, coma y beba algo. Luego, contine descansando y Industrial/product designer. Si siente al menos durante esa hora, puede dejar de contar. Comunquese con un mdico si:  Siente menos de en 2horas.  El beb no se mueve tanto como suele hacerlo. Fecha:  ____________ Kim Blair inicio: ____________ Kim Blair finalizacin: ____________ Movimientos: ____________ Kim Blair: ____________ Kim Blair inicio: ____________ Kim Blair finalizacin: ____________ Movimientos: ____________ Kim Blair: ____________ Kim Blair inicio: ____________ Kim Blair finalizacin: ____________ Movimientos: ____________ Kim Blair: ____________ Kim Blair inicio: ____________ Kim Blair finalizacin: ____________ Movimientos: ____________ Kim Blair: ____________ Kim Blair inicio: ____________ Kim Blair de finalizacin: ____________ Movimientos: ____________ Kim Blair: ____________ Kim Blair inicio: ____________ Kim Blair de finalizacin: ____________ Movimientos: ____________ Kim Blair: ____________ Kim Blair inicio: ____________ Kim Blair de finalizacin: ____________ Movimientos: ____________ Kim Blair: ____________ Kim Blair inicio: ____________ Kim Blair finalizacin: ____________ Movimientos: ____________ Kim Blair: ____________ Kim Blair inicio: ____________ Kim Blair de finalizacin: ____________ Movimientos: ____________ Esta informacin no tiene como fin reemplazar el consejo del mdico. Asegrese de hacerle al mdico cualquier pregunta que tenga. Document Released: 09/19/2007 Document Revised: 09/15/2016 Document Reviewed: 07/22/2015 Elsevier Interactive Patient Education  2019 [employer]. [employer] de Braxton Hicks Braxton Hicks Contractions Las contracciones del tero pueden presentarse durante todo el [place], [place] no siempre indican que la mujer est de [place]. Es posible que usted haya tenido contracciones de prctica llamadas "contracciones de [place]". A veces, se las confunde con el parto real. Qu son las contracciones de [place]? Las contracciones de [place] son espasmos que se producen en los msculos del tero antes del [place]. A diferencia de las contracciones del parto verdadero, estas no producen el agrandamiento (la dilatacin) ni el afinamiento del cuello uterino. Hacia el final del embarazo [facility] las semanas  [phone]), las contracciones de Braxton Hicks pueden presentarse ms seguido y tornarse ms intensas. A veces, resulta difcil distinguirlas del parto verdadero porque pueden ser [employer]. No debe sentirse avergonzada si concurre al hospital con falso parto. En ocasiones, la nica forma de saber si el  trabajo de parto es verdadero es que el mdico determine si hay cambios en el cuello del tero. El [employer] har un examen fsico y International aid/development worker controle las contracciones. Si usted no est de Systems developer, el examen debe indicar que el cuello uterino no est dilatado y que usted no ha roto Barista. Si no hay otros problemas de salud asociados con su embarazo, no habr inconvenientes si la envan a su casa con un falso parto. Es posible que las contracciones de Braxton Hicks continen hasta que se desencadene el parto verdadero. Cmo diferenciar el Aleen Campi de parto falso del verdadero Trabajo de parto verdadero  Las contracciones duran de [place].  Las contracciones pueden tornarse muy regulares.  La molestia generalmente se siente en la parte superior del tero y se extiende hacia la zona baja del abdomen y [employer] cintura.  Las contracciones no desaparecen cuando usted camina.  Las contracciones generalmente se hacen ms intensas y Comptroller.  El cuello uterino se dilata y se afina. Parto falso  En general, las contracciones son ms cortas y no tan intensas como las del parto verdadero.  En general, las contracciones son irregulares.  A menudo, las contracciones se sienten en la parte delantera de la parte baja del abdomen y en la ingle.  Las Futures trader cuando usted camina o [place] de posicin mientras est [place].  Las contracciones se vuelven ms dbiles y su duracin es menor a medida que transcurre [employer].  En general, el cuello uterino no se dilata ni se afina. Siga estas indicaciones en su casa:   Tome los medicamentos de venta  libre y los recetados solamente como se lo haya indicado el mdico.  Contine haciendo los ejercicios habituales y siga las dems indicaciones que el mdico le d.  Coma y beba con moderacin si cree que est de parto.  Si las contracciones de [employer] provocan incomodidad: ? Cambie de posicin: si est acostada o descansando, camine; si est caminando, descanse. ? Sintese y descanse en una baera con agua tibia. ? Beba suficiente lquido como para mantener la orina de color amarillo plido. La deshidratacin puede provocar contracciones. ? Respire lenta y profundamente varias veces por hora.  Vaya a todas las visitas de control prenatales y de control como se lo haya indicado el mdico. Esto es importante. Comunquese con un mdico si:  Tiene fiebre.  Siente dolor constante en el abdomen. Solicite ayuda de inmediato si:  Las contracciones se intensifican, se hacen ms regulares y Arboriculturist s.  Tiene una prdida de lquido por la vagina.  Elimina una mucosidad sanguinolenta (prdida del tapn mucoso).  Tiene una hemorragia vaginal.  Tiene un dolor en la zona lumbar que nunca tuvo antes.  Siente que la cabeza del beb empuja hacia abajo y ejerce presin en la zona plvica.  El beb no se mueve tanto como antes. Resumen  Las [employer] se presentan antes del parto se conocen como contracciones de [place], [place] parto o contracciones de Multimedia programmer.  En general, las contracciones de [address] son ms cortas, ms dbiles, con ms tiempo entre una y [place], y menos regulares que las contracciones del parto verdadero. Las contracciones del parto verdadero se intensifican progresivamente y se tornan regulares y ms frecuentes.  Para controlar la [employer] producen las contracciones de [place], puede cambiar de posicin, darse un bao templado y Lawyer, beber mucha agua o practicar la respiracin profunda. Esta informacin no tiene  como fin  reemplazar el consejo del mdico. Asegrese de hacerle al mdico cualquier pregunta que tenga. Document Released: 01/22/2017 Document Revised: 06/04/2017 Document Reviewed: 01/22/2017 Elsevier Interactive Patient Education  2019 [employer].

## 2018-08-21 NOTE — Progress Notes (Signed)
   PRENATAL VISIT NOTE  Subjective:  Kim Blair is a 28 y.o. G2P1001 at [redacted]w[redacted]d being seen today for ongoing prenatal care.  She is currently monitored for the following issues for this low-risk pregnancy and has Supervision of other normal pregnancy, antepartum; Sciatica of left side; Language barrier; and GBS (group B Streptococcus carrier), +RV culture, currently pregnant on their problem list.  Patient reports no complaints.  Contractions: Not present. Vag. Bleeding: None.  Movement: Present. Denies leaking of fluid.   The following portions of the patient's history were reviewed and updated as appropriate: allergies, current medications, past family history, past medical history, past social history, past surgical history and problem list. Problem list updated.  Objective:   Vitals:   08/21/18 1530  BP: 123/76  Pulse: 83  Weight: 206 lb 14.4 oz (93.8 kg)    Fetal Status: Fetal Heart Rate (bpm): 163 Fundal Height: 39 cm Movement: Present     General:  Alert, oriented and cooperative. Patient is in no acute distress.  Skin: Skin is warm and dry. No rash noted.   Cardiovascular: Normal heart rate noted  Respiratory: Normal respiratory effort, no problems with respiration noted  Abdomen: Soft, gravid, appropriate for gestational age.  Pain/Pressure: Present     Pelvic: Cervical exam deferred        Extremities: Normal range of motion.  Edema: Trace  Mental Status: Normal mood and affect. Normal behavior. Normal judgment and thought content.   Assessment and Plan:  Pregnancy: G2P1001 at [redacted]w[redacted]d  1. Supervision of other normal pregnancy, antepartum - Doing well, no contractions, bleeding or LOF. Normal FM - IOL scheduled  2. GBS (group B Streptococcus carrier), +RV culture, currently pregnant - Treat in labor. Patient advised today.   Term labor symptoms and general obstetric precautions including but not limited to vaginal bleeding, contractions, leaking of fluid and  fetal movement were reviewed in detail with the patient. Please refer to After Visit Summary for other counseling recommendations.  Return in about 1 week (around 08/28/2018) for LOB, NST/BPP.  Vonzella Nipple, PA-C

## 2018-08-27 ENCOUNTER — Telehealth: Payer: Self-pay

## 2018-08-27 NOTE — Telephone Encounter (Signed)
Called pt to advise of scheduled Induction on 09/03/2018 at 7:30am.Used Spanish [employer] id# [id number], no answer, left VM.

## 2018-08-28 ENCOUNTER — Encounter (HOSPITAL_COMMUNITY): Payer: Self-pay

## 2018-08-28 ENCOUNTER — Ambulatory Visit: Payer: Self-pay

## 2018-08-28 ENCOUNTER — Inpatient Hospital Stay (HOSPITAL_COMMUNITY): Admission: AD | Admit: 2018-08-28 | Payer: Self-pay | Source: Home / Self Care | Admitting: Family Medicine

## 2018-08-28 ENCOUNTER — Inpatient Hospital Stay (HOSPITAL_COMMUNITY)
Admission: AD | Admit: 2018-08-28 | Discharge: 2018-09-01 | DRG: 807 | Disposition: A | Discharge status: Home / Self Care | Payer: Medicaid Other | Attending: Family Medicine | Admitting: Family Medicine

## 2018-08-28 ENCOUNTER — Ambulatory Visit (INDEPENDENT_AMBULATORY_CARE_PROVIDER_SITE_OTHER): Payer: Medicaid Other | Admitting: *Deleted

## 2018-08-28 ENCOUNTER — Ambulatory Visit (INDEPENDENT_AMBULATORY_CARE_PROVIDER_SITE_OTHER): Payer: Medicaid Other | Admitting: Medical

## 2018-08-28 ENCOUNTER — Encounter: Payer: Self-pay | Admitting: Medical

## 2018-08-28 ENCOUNTER — Other Ambulatory Visit: Payer: Self-pay

## 2018-08-28 VITALS — BP 113/64 | HR 75 | Wt 207.9 lb

## 2018-08-28 DIAGNOSIS — O9902 Anemia complicating childbirth: Secondary | ICD-10-CM | POA: Diagnosis present

## 2018-08-28 DIAGNOSIS — O480 Post-term pregnancy: Secondary | ICD-10-CM

## 2018-08-28 DIAGNOSIS — O99820 Streptococcus B carrier state complicating pregnancy: Secondary | ICD-10-CM

## 2018-08-28 DIAGNOSIS — M5432 Sciatica, left side: Secondary | ICD-10-CM | POA: Diagnosis present

## 2018-08-28 DIAGNOSIS — Z3A40 40 weeks gestation of pregnancy: Secondary | ICD-10-CM

## 2018-08-28 DIAGNOSIS — O9089 Other complications of the puerperium, not elsewhere classified: Secondary | ICD-10-CM | POA: Diagnosis not present

## 2018-08-28 DIAGNOSIS — R748 Abnormal levels of other serum enzymes: Secondary | ICD-10-CM

## 2018-08-28 DIAGNOSIS — O99019 Anemia complicating pregnancy, unspecified trimester: Secondary | ICD-10-CM | POA: Diagnosis present

## 2018-08-28 DIAGNOSIS — O26893 Other specified pregnancy related conditions, third trimester: Secondary | ICD-10-CM | POA: Diagnosis present

## 2018-08-28 DIAGNOSIS — O99214 Obesity complicating childbirth: Secondary | ICD-10-CM | POA: Diagnosis present

## 2018-08-28 DIAGNOSIS — Z3480 Encounter for supervision of other normal pregnancy, unspecified trimester: Secondary | ICD-10-CM

## 2018-08-28 DIAGNOSIS — O368130 Decreased fetal movements, third trimester, not applicable or unspecified: Secondary | ICD-10-CM | POA: Diagnosis present

## 2018-08-28 DIAGNOSIS — O99824 Streptococcus B carrier state complicating childbirth: Secondary | ICD-10-CM | POA: Diagnosis present

## 2018-08-28 DIAGNOSIS — D649 Anemia, unspecified: Secondary | ICD-10-CM | POA: Diagnosis present

## 2018-08-28 DIAGNOSIS — Z9189 Other specified personal risk factors, not elsewhere classified: Secondary | ICD-10-CM

## 2018-08-28 DIAGNOSIS — R51 Headache: Secondary | ICD-10-CM | POA: Diagnosis not present

## 2018-08-28 LAB — CBC
HCT: 36.1 % (ref 36.0–46.0)
Hemoglobin: 11.9 g/dL — ABNORMAL LOW (ref 12.0–15.0)
MCH: 30 pg (ref 26.0–34.0)
MCHC: 33 g/dL (ref 30.0–36.0)
MCV: 90.9 fL (ref 80.0–100.0)
Platelets: 180 10*3/uL (ref 150–400)
RBC: 3.97 MIL/uL (ref 3.87–5.11)
RDW: 13.3 % (ref 11.5–15.5)
WBC: 9.1 10*3/uL (ref 4.0–10.5)
nRBC: 0 % (ref 0.0–0.2)

## 2018-08-28 LAB — TYPE AND SCREEN
ABO/RH(D): O POS
Antibody Screen: NEGATIVE

## 2018-08-28 MED ORDER — LACTATED RINGERS IV SOLN
INTRAVENOUS | Status: DC
  Administered 2018-08-28 – 2018-08-29 (×2): via INTRAVENOUS

## 2018-08-28 MED ORDER — OXYCODONE-ACETAMINOPHEN 5-325 MG PO TABS: 1.0000 | ORAL_TABLET | ORAL | Status: DC | PRN

## 2018-08-28 MED ORDER — OXYTOCIN 40 UNITS IN NORMAL SALINE INFUSION - SIMPLE MED: 2.5000 [IU]/h | INTRAVENOUS | Status: DC

## 2018-08-28 MED ORDER — ACETAMINOPHEN 325 MG PO TABS: 650.0000 mg | ORAL_TABLET | ORAL | Status: DC | PRN

## 2018-08-28 MED ORDER — TERBUTALINE SULFATE 1 MG/ML IJ SOLN: 0.2500 mg | Freq: Once | INTRAMUSCULAR | Status: DC | PRN

## 2018-08-28 MED ORDER — SODIUM CHLORIDE 0.9 % IV SOLN
5.0000 10*6.[IU] | Freq: Once | INTRAVENOUS | Status: AC
  Administered 2018-08-28: 5 10*6.[IU] via INTRAVENOUS
  Filled 2018-08-28: qty 5

## 2018-08-28 MED ORDER — LIDOCAINE HCL (PF) 1 % IJ SOLN
30.0000 mL | INTRAMUSCULAR | Status: AC | PRN
  Administered 2018-08-29: 30 mL via SUBCUTANEOUS
  Filled 2018-08-28: qty 30

## 2018-08-28 MED ORDER — PENICILLIN G 3 MILLION UNITS IVPB - SIMPLE MED
3.0000 10*6.[IU] | INTRAVENOUS | Status: DC
  Administered 2018-08-29 (×2): 3 10*6.[IU] via INTRAVENOUS
  Filled 2018-08-28 (×4): qty 100

## 2018-08-28 MED ORDER — OXYTOCIN 40 UNITS IN NORMAL SALINE INFUSION - SIMPLE MED
1.0000 m[IU]/min | INTRAVENOUS | Status: DC
  Administered 2018-08-29: 2 m[IU]/min via INTRAVENOUS
  Filled 2018-08-28: qty 1000

## 2018-08-28 MED ORDER — OXYTOCIN BOLUS FROM INFUSION
500.0000 mL | Freq: Once | INTRAVENOUS | Status: DC
  Administered 2018-08-29: 500 mL via INTRAVENOUS

## 2018-08-28 MED ORDER — MISOPROSTOL 25 MCG QUARTER TABLET
25.0000 ug | ORAL_TABLET | ORAL | Status: DC | PRN
  Administered 2018-08-28: 25 ug via VAGINAL
  Filled 2018-08-28 (×2): qty 1

## 2018-08-28 MED ORDER — SOD CITRATE-CITRIC ACID 500-334 MG/5ML PO SOLN: 30.0000 mL | ORAL | Status: DC | PRN

## 2018-08-28 MED ORDER — LACTATED RINGERS IV SOLN: 500.0000 mL | INTRAVENOUS | Status: DC | PRN

## 2018-08-28 MED ORDER — PENICILLIN G 3 MILLION UNITS IVPB - SIMPLE MED: 3.0000 10*6.[IU] | INTRAVENOUS | Status: DC

## 2018-08-28 MED ORDER — FLEET ENEMA 7-19 GM/118ML RE ENEM: 1.0000 | ENEMA | RECTAL | Status: DC | PRN

## 2018-08-28 MED ORDER — OXYCODONE-ACETAMINOPHEN 5-325 MG PO TABS: 2.0000 | ORAL_TABLET | ORAL | Status: DC | PRN

## 2018-08-28 MED ORDER — ONDANSETRON HCL 4 MG/2ML IJ SOLN: 4.0000 mg | Freq: Four times a day (QID) | INTRAMUSCULAR | Status: DC | PRN

## 2018-08-28 NOTE — H&P (Addendum)
OBSTETRIC ADMISSION HISTORY AND PHYSICAL  Kim Blair is a 28 y.o. female G2P1001 with IUP at [redacted]w[redacted]d GBS + Post term low-risk pregnancy antepartum; Sciatica of left side  presenting for IOL She has also been reporting some decreased fetal movements NRFHT  but has a reassuring NST strip on the floor currently .She reports +FMs. No LOF, VB, blurry vision, headaches, peripheral edema, or RUQ pain. She plans on Breast and formula feeding. She requests Copper T for birth control.  Previous delivery was at 41+2 SVD and with a weight of 3402g   Dating: By LMP --->  Estimated Date of Delivery: 08/27/18  Sono:    @27  w 0 d, CWD, normal anatomy, Vertex  presentation, 1124gg, 30%ile, EFW 2lb 8oz   Prenatal History/Complications: None   Past Medical History: Past Medical History:  Diagnosis Date  . PCOS (polycystic ovarian syndrome)     Past Surgical History: Past Surgical History:  Procedure Laterality Date  . NO PAST SURGERIES      Obstetrical History: OB History    Gravida  2   Para  1   Term  1   Preterm  0   AB  0   Living  1     SAB  0   TAB  0   Ectopic  0   Multiple  0   Live Births  1           Social History: Social History   Socioeconomic History  . Marital status: Single    Spouse name: Not on file  . Number of children: Not on file  . Years of education: Not on file  . Highest education level: Not on file  Occupational History  . Not on file  Social Needs  . Financial resource strain: Not on file  . Food insecurity:    Worry: Sometimes true    Inability: Sometimes true  . Transportation needs:    Medical: No    Non-medical: Yes  Tobacco Use  . Smoking status: Never Smoker  . Smokeless tobacco: Never Used  Substance and Sexual Activity  . Alcohol use: No  . Drug use: No  . Sexual activity: Not Currently    Birth control/protection: None  Lifestyle  . Physical activity:    Days per week: Not on file    Minutes per session:  Not on file  . Stress: Not on file  Relationships  . Social connections:    Talks on phone: Not on file    Gets together: Not on file    Attends religious service: Not on file    Active member of club or organization: Not on file    Attends meetings of clubs or organizations: Not on file    Relationship status: Not on file  Other Topics Concern  . Not on file  Social History Narrative  . Not on file    Family History: No family history on file.  Allergies: No Known Allergies  Medications Prior to Admission  Medication Sig Dispense Refill Last Dose  . Prenat w/o A Vit-FeFum-FePo-FA (CONCEPT OB) 130-92.4-1 MG CAPS Take 1 capsule by mouth daily. 30 capsule 9 Taking     Review of Systems   All systems reviewed and negative except as stated in HPI  Last menstrual period 11/20/2017, currently breastfeeding. General appearance: alert, cooperative and no distress Lungs: regular rate and effort Heart: regular rate  Abdomen: soft, non-tender Extremities: Homans sign is negative, no sign of DVT Presentation: cephalic Fetal  monitoringBaseline: 150 bpm, Variability: Good {> 6 bpm), Accelerations: Reactive and Decelerations: Absent Uterine activity Irritable    Dilation: Closed Effacement (%): 70 Presentation: Vertex Exam by:: Dr. Marcy Siren  Prenatal labs: ABO, Rh: O/Positive/-- (08/19 0933) Antibody: Comment, See Final Results (08/19 0933) Rubella: 2.94 (08/19 0933) RPR: Non Reactive (12/09 0825)  HBsAg: Negative (08/19 0933)  HIV: Non Reactive (12/09 0825)  GBS:   Positive  2 hr GTT Negative 06/03/2018  Prenatal Transfer Tool  Maternal Diabetes: No Genetic Screening: Normal Maternal Ultrasounds/Referrals: Normal Fetal Ultrasounds or other Referrals:  None Maternal Substance Abuse:  No Significant Maternal Medications:  None Significant Maternal Lab Results: Lab values include: Group B Strep positive  No results found for this or any previous visit (from  the past 24 hour(s)).  Patient Active Problem List   Diagnosis Date Noted  . Post-dates pregnancy 08/28/2018  . GBS (group B Streptococcus carrier), +RV culture, currently pregnant 08/15/2018  . Language barrier 06/14/2018  . Sciatica of left side 05/06/2018  . Supervision of other normal pregnancy, antepartum 02/11/2018    Assessment:  Kim Blair is a 28 y.o. female G2P1001 with IUP at [redacted]w[redacted]d GBS + Post term presenting for IOL She has also been reporting some decreased fetal movements but has a reassuring NSTstrip and reports that she is having fetal movements.   GBS+  Will start Pen G 5 Million units followed by 3 million Q4h   IOL Bishop of 4 will ripen cervix with Cytotec PV  Will recheck and reassess for starting pit   Decreased Fetal movent and NRFHT :  Resolving and with reassuring strip   1. Labor: Not in labor  2. FWB: Cat 1  3. Pain: None  4. GBS: Positive  5. Cervical check difficult, Posterior thick and unable to hook internal os cephalic presentation on digital exam. Bishop of 4 unfavorable cervix for induction with Pitocin.    Plan: -Penicillin G 5 million units now  -Continue with Pen G 3 million units Q4h  -Cytotec PV for cervical ripening  -Start Pitocin with favorable cervical check after ripening   -CBC, RPR and type and screen done  Sandi Raveling, MD PGY 1 Family medicine resident [facility] [place] 08/28/2018, 7:53 PM   OB FELLOW HISTORY AND PHYSICAL ATTESTATION  I have seen and examined this patient; I agree with above documentation in the resident's note.   Marcy Siren, D.O. OB Fellow  08/28/2018, 9:12 PM

## 2018-08-28 NOTE — Progress Notes (Signed)
Interpreter Eda Royal present for encounter. Pt reports decreased FM x4 days.   Pt informed that the ultrasound is considered a limited OB ultrasound and is not intended to be a complete ultrasound exam.  Patient also informed that the ultrasound is not being completed with the intent of assessing for fetal or placental anomalies or any pelvic abnormalities.  Explained that the purpose of today's ultrasound is to assess for presentation, BPP and amniotic fluid volume.  Patient acknowledges the purpose of the exam and the limitations of the study.

## 2018-08-28 NOTE — Patient Instructions (Signed)
Evaluacin de los movimientos fetales Fetal Movement Counts Introduccin Nombre del paciente: ________________________________________________ Kim Blair estimada: ____________________ [name] evaluacin de los movimientos fetales?  Una evaluacin de los movimientos fetales es el registro del nmero de veces que siente que el beb se mueve durante un cierto perodo de [place]. Esto tambin se puede denominar recuento de patadas fetales. Una evaluacin de movimientos fetales se recomienda a todas las embarazadas. Es posible que le indiquen que comience a Development worker, community los movimientos fetales desde la semana 28 de [place]. Preste atencin cuando sienta que el beb est ms activo. Podr detectar los ciclos en que el beb duerme y est despierto. Tambin podr detectar que ciertas cosas hacen que su beb se mueva ms. Deber realizar una evaluacin de los movimientos fetales en las siguientes situaciones:  Cuando el beb est ms activo habitualmente.  A la [employer], todos los [place]. Un buen momento para evaluar los movimientos fetales es cuando est descansando, despus de haber comido y bebido algo. Cmo debo contar los movimientos fetales? 1. Encuentre un lugar tranquilo y cmodo. Sintese o acustese de lado. 2. Anote la fecha, la hora de inicio y de finalizacin y la cantidad de movimientos que sinti entre esas dos horas. Lleve esta informacin a las visitas de control. 3. Cuente las pataditas, revoloteos, chasquidos, vueltas o pinchazos en un perodo de 2horas. Debe sentir al menos en 2horas. 4. Cuando sienta , puede dejar de contar. 5. Si no siente en 2horas, coma y beba algo. Luego, contine descansando y Industrial/product designer. Si siente al menos durante esa hora, puede dejar de contar. Comunquese con un mdico si:  Siente menos de en 2horas.  El beb no se mueve tanto como suele hacerlo. Fecha:  ____________ [name] inicio: ____________ [name] finalizacin: ____________ Movimientos: ____________ [name]: ____________ [name] inicio: ____________ [name] finalizacin: ____________ Movimientos: ____________ [name]: ____________ [name] inicio: ____________ [name] finalizacin: ____________ Movimientos: ____________ [name]: ____________ [name] inicio: ____________ [name] finalizacin: ____________ Movimientos: ____________ [name]: ____________ [name] inicio: ____________ [name] de finalizacin: ____________ Movimientos: ____________ [name]: ____________ [name] inicio: ____________ [name] de finalizacin: ____________ Movimientos: ____________ [name]: ____________ [name] inicio: ____________ [name] de finalizacin: ____________ Movimientos: ____________ [name]: ____________ [name] inicio: ____________ [name] finalizacin: ____________ Movimientos: ____________ [name]: ____________ [name] inicio: ____________ [name] de finalizacin: ____________ Movimientos: ____________ Esta informacin no tiene como fin reemplazar el consejo del mdico. Asegrese de hacerle al mdico cualquier pregunta que tenga. Document Released: 09/19/2007 Document Revised: 09/15/2016 Document Reviewed: 07/22/2015 Elsevier Interactive Patient Education  2019 [employer]. [employer] de Braxton Hicks Braxton Hicks Contractions Las contracciones del tero pueden presentarse durante todo el [place], [place] no siempre indican que la mujer est de [place]. Es posible que usted haya tenido contracciones de prctica llamadas "contracciones de [place]". A veces, se las confunde con el parto real. Qu son las contracciones de [place]? Las contracciones de [place] son espasmos que se producen en los msculos del tero antes del [place]. A diferencia de las contracciones del parto verdadero, estas no producen el agrandamiento (la dilatacin) ni el afinamiento del cuello uterino. Hacia el final del embarazo [facility] las semanas  [phone]), las contracciones de Braxton Hicks pueden presentarse ms seguido y tornarse ms intensas. A veces, resulta difcil distinguirlas del parto verdadero porque pueden ser [employer]. No debe sentirse avergonzada si concurre al hospital con falso parto. En ocasiones, la nica forma de saber si el  trabajo de parto es verdadero es que el médico determine si hay cambios en el cuello del útero. El médico le hará un examen físico y quizás le controle las contracciones. Si usted no está de parto verdadero, el examen debe indicar que el cuello uterino no está dilatado y que usted no ha roto bolsa. °Si no hay otros problemas de salud asociados con su embarazo, no habrá inconvenientes si la envían a su casa con un falso parto. Es posible que las contracciones de Braxton Hicks continúen hasta que se desencadene el parto verdadero. °Cómo diferenciar el trabajo de parto falso del verdadero °Trabajo de parto verdadero °· Las contracciones duran de 30 a 70 segundos. °· Las contracciones pueden tornarse muy regulares. °· La molestia generalmente se siente en la parte superior del útero y se extiende hacia la zona baja del abdomen y hacia la cintura. °· Las contracciones no desaparecen cuando usted camina. °· Las contracciones generalmente se hacen más intensas y aumentan en frecuencia. °· El cuello uterino se dilata y se afina. °Parto falso °· En general, las contracciones son más cortas y no tan intensas como las del parto verdadero. °· En general, las contracciones son irregulares. °· A menudo, las contracciones se sienten en la parte delantera de la parte baja del abdomen y en la ingle. °· Las contracciones pueden desaparecer cuando usted camina o cambia de posición mientras está acostada. °· Las contracciones se vuelven más débiles y su duración es menor a medida que transcurre el tiempo. °· En general, el cuello uterino no se dilata ni se afina. °Siga estas indicaciones en su casa: ° °· Tome los medicamentos de venta  libre y los recetados solamente como se lo haya indicado el médico. °· Continúe haciendo los ejercicios habituales y siga las demás indicaciones que el médico le dé. °· Coma y beba con moderación si cree que está de parto. °· Si las contracciones de Braxton Hicks le provocan incomodidad: °? Cambie de posición: si está acostada o descansando, camine; si está caminando, descanse. °? Siéntese y descanse en una bañera con agua tibia. °? Beba suficiente líquido como para mantener la orina de color amarillo pálido. La deshidratación puede provocar contracciones. °? Respire lenta y profundamente varias veces por hora. °· Vaya a todas las visitas de control prenatales y de control como se lo haya indicado el médico. Esto es importante. °Comuníquese con un médico si: °· Tiene fiebre. °· Siente dolor constante en el abdomen. °Solicite ayuda de inmediato si: °· Las contracciones se intensifican, se hacen más regulares y cercanas entre sí. °· Tiene una pérdida de líquido por la vagina. °· Elimina una mucosidad sanguinolenta (pérdida del tapón mucoso). °· Tiene una hemorragia vaginal. °· Tiene un dolor en la zona lumbar que nunca tuvo antes. °· Siente que la cabeza del bebé empuja hacia abajo y ejerce presión en la zona pélvica. °· El bebé no se mueve tanto como antes. °Resumen °· Las contracciones que se presentan antes del parto se conocen como contracciones de Braxton Hicks, falso parto o contracciones de práctica. °· En general, las contracciones de Braxton Hicks son más cortas, más débiles, con más tiempo entre una y otra, y menos regulares que las contracciones del parto verdadero. Las contracciones del parto verdadero se intensifican progresivamente y se tornan regulares y más frecuentes. °· Para controlar la molestia que producen las contracciones de Braxton Hicks, puede cambiar de posición, darse un baño templado y descansar, beber mucha agua o practicar la respiración profunda. °Esta información no tiene   como fin  reemplazar el consejo del mdico. Asegrese de hacerle al mdico cualquier pregunta que tenga. Document Released: 01/22/2017 Document Revised: 06/04/2017 Document Reviewed: 01/22/2017 Elsevier Interactive Patient Education  2019 [employer].

## 2018-08-28 NOTE — Anesthesia Pain Management Evaluation Note (Signed)
  CRNA Pain Management Visit Note  Patient: Kim Blair, 29 y.o., female  "Hello I am a member of the anesthesia team at [facility]. We have an anesthesia team available at all times to provide care throughout the hospital, including epidural management and anesthesia for C-section. I don't know your plan for the delivery whether it a natural birth, water birth, IV sedation, nitrous supplementation, doula or epidural, but we want to meet your pain goals."   1.Was your pain managed to your expectations on prior hospitalizations?   Yes   2.What is your expectation for pain management during this hospitalization?     Epidural  3.How can we help you reach that goal? Place epidural at pain goal.  Record the patient's initial score and the patient's pain goal.   Pain: 3  Pain Goal: 5 [facility] wants you to be able to say your pain was always managed very well.  Jahlil Ziller 08/28/2018

## 2018-08-28 NOTE — Progress Notes (Signed)
   PRENATAL VISIT NOTE  Subjective:  Kim Blair is a 28 y.o. G2P1001 at [redacted]w[redacted]d being seen today for ongoing prenatal care.  She is currently monitored for the following issues for this low-risk pregnancy and has Supervision of other normal pregnancy, antepartum; Sciatica of left side; Language barrier; and GBS (group B Streptococcus carrier), +RV culture, currently pregnant on their problem list.  Patient reports backache.  Contractions: Irregular. Vag. Bleeding: None.  Movement: (!) Decreased. Denies leaking of fluid.   The following portions of the patient's history were reviewed and updated as appropriate: allergies, current medications, past family history, past medical history, past social history, past surgical history and problem list. Problem list updated.  Objective:   Vitals:   08/28/18 1440  BP: 113/64  Pulse: 75  Weight: 207 lb 14.4 oz (94.3 kg)    Fetal Status: Fetal Heart Rate (bpm): NST   Movement: (!) Decreased     General:  Alert, oriented and cooperative. Patient is in no acute distress.  Skin: Skin is warm and dry. No rash noted.   Cardiovascular: Normal heart rate noted  Respiratory: Normal respiratory effort, no problems with respiration noted  Abdomen: Soft, gravid, appropriate for gestational age.  Pain/Pressure: Present     Pelvic: Cervical exam deferred        Extremities: Normal range of motion.  Edema: Trace  Mental Status: Normal mood and affect. Normal behavior. Normal judgment and thought content.   Fetal Monitoring: Baseline: 130 bpm Variability: moderate Accelerations: 15 x 15 Decelerations: none Contractions: one  Assessment and Plan:  Pregnancy: G2P1001 at [redacted]w[redacted]d  1. Supervision of other normal pregnancy, antepartum - reports decreased fetal movement x 4 days  - NST reactive - Discussed with Dr. Shawnie Pons. Sending for IOL today  2. Post term pregnancy, antepartum condition or complication  3. GBS (group B Streptococcus carrier), +RV  culture, currently pregnant  Term labor symptoms and general obstetric precautions including but not limited to vaginal bleeding, contractions, leaking of fluid and fetal movement were reviewed in detail with the patient. Please refer to After Visit Summary for other counseling recommendations.  Return in about 5 weeks (around 10/02/2018) for PP visit.  IOL on 3/10.  Future Appointments  Date Time Provider Department Center  09/03/2018  7:30 AM [facility] ROOM [facility] None    Vonzella Nipple, PA-C

## 2018-08-29 ENCOUNTER — Inpatient Hospital Stay (HOSPITAL_COMMUNITY): Payer: Medicaid Other | Admitting: Anesthesiology

## 2018-08-29 ENCOUNTER — Encounter (HOSPITAL_COMMUNITY): Payer: Self-pay | Admitting: *Deleted

## 2018-08-29 LAB — ABO/RH: ABO/RH(D): O POS

## 2018-08-29 LAB — RPR: RPR Ser Ql: NONREACTIVE

## 2018-08-29 MED ORDER — METHYLERGONOVINE MALEATE 0.2 MG/ML IJ SOLN
INTRAMUSCULAR | Status: AC
  Filled 2018-08-29: qty 1

## 2018-08-29 MED ORDER — LIDOCAINE-EPINEPHRINE (PF) 2 %-1:200000 IJ SOLN
INTRAMUSCULAR | Status: DC | PRN
  Administered 2018-08-29: 5 mL via EPIDURAL
  Administered 2018-08-29: 1 mL via EPIDURAL

## 2018-08-29 MED ORDER — DIPHENHYDRAMINE HCL 25 MG PO CAPS: 25.0000 mg | ORAL_CAPSULE | Freq: Four times a day (QID) | ORAL | Status: DC | PRN

## 2018-08-29 MED ORDER — SODIUM CHLORIDE (PF) 0.9 % IJ SOLN
INTRAMUSCULAR | Status: DC | PRN
  Administered 2018-08-29: 12 mL/h via EPIDURAL

## 2018-08-29 MED ORDER — ONDANSETRON HCL 4 MG/2ML IJ SOLN: 4.0000 mg | INTRAMUSCULAR | Status: DC | PRN

## 2018-08-29 MED ORDER — ACETAMINOPHEN 325 MG PO TABS
650.0000 mg | ORAL_TABLET | ORAL | Status: DC | PRN
  Administered 2018-08-29 – 2018-08-30 (×2): 650 mg via ORAL
  Filled 2018-08-29 (×2): qty 2

## 2018-08-29 MED ORDER — PRENATAL MULTIVITAMIN CH
1.0000 | ORAL_TABLET | Freq: Every day | ORAL | Status: DC
  Administered 2018-08-29 – 2018-08-31 (×3): 1 via ORAL
  Filled 2018-08-29 (×2): qty 1

## 2018-08-29 MED ORDER — PHENYLEPHRINE 40 MCG/ML (10ML) SYRINGE FOR IV PUSH (FOR BLOOD PRESSURE SUPPORT): 80.0000 ug | PREFILLED_SYRINGE | INTRAVENOUS | Status: DC | PRN

## 2018-08-29 MED ORDER — COCONUT OIL OIL
1.0000 "application " | TOPICAL_OIL | Status: DC | PRN
  Administered 2018-09-01: 1 via TOPICAL

## 2018-08-29 MED ORDER — EPHEDRINE 5 MG/ML INJ: 10.0000 mg | INTRAVENOUS | Status: DC | PRN

## 2018-08-29 MED ORDER — SENNOSIDES-DOCUSATE SODIUM 8.6-50 MG PO TABS
2.0000 | ORAL_TABLET | ORAL | Status: DC
  Administered 2018-08-30 – 2018-08-31 (×3): 2 via ORAL
  Filled 2018-08-29 (×3): qty 2

## 2018-08-29 MED ORDER — MEASLES, MUMPS & RUBELLA VAC IJ SOLR: 0.5000 mL | Freq: Once | INTRAMUSCULAR | Status: DC

## 2018-08-29 MED ORDER — FENTANYL-BUPIVACAINE-NACL 0.5-0.125-0.9 MG/250ML-% EP SOLN
12.0000 mL/h | EPIDURAL | Status: DC | PRN
  Filled 2018-08-29: qty 250

## 2018-08-29 MED ORDER — IBUPROFEN 600 MG PO TABS
600.0000 mg | ORAL_TABLET | Freq: Four times a day (QID) | ORAL | Status: DC
  Administered 2018-08-29 – 2018-09-01 (×12): 600 mg via ORAL
  Filled 2018-08-29 (×10): qty 1

## 2018-08-29 MED ORDER — MISOPROSTOL 200 MCG PO TABS
ORAL_TABLET | ORAL | Status: AC
  Filled 2018-08-29: qty 4

## 2018-08-29 MED ORDER — PHENYLEPHRINE 40 MCG/ML (10ML) SYRINGE FOR IV PUSH (FOR BLOOD PRESSURE SUPPORT)
80.0000 ug | PREFILLED_SYRINGE | INTRAVENOUS | Status: DC | PRN
  Filled 2018-08-29: qty 10

## 2018-08-29 MED ORDER — LACTATED RINGERS IV SOLN
500.0000 mL | Freq: Once | INTRAVENOUS | Status: AC
  Administered 2018-08-29: 500 mL via INTRAVENOUS

## 2018-08-29 MED ORDER — TETANUS-DIPHTH-ACELL PERTUSSIS 5-2.5-18.5 LF-MCG/0.5 IM SUSP: 0.5000 mL | Freq: Once | INTRAMUSCULAR | Status: DC

## 2018-08-29 MED ORDER — ZOLPIDEM TARTRATE 5 MG PO TABS: 5.0000 mg | ORAL_TABLET | Freq: Every evening | ORAL | Status: DC | PRN

## 2018-08-29 MED ORDER — MISOPROSTOL 200 MCG PO TABS
800.0000 ug | ORAL_TABLET | Freq: Once | ORAL | Status: AC
  Administered 2018-08-29: 800 ug via RECTAL

## 2018-08-29 MED ORDER — ONDANSETRON HCL 4 MG PO TABS: 4.0000 mg | ORAL_TABLET | ORAL | Status: DC | PRN

## 2018-08-29 MED ORDER — METHYLERGONOVINE MALEATE 0.2 MG/ML IJ SOLN
0.2000 mg | Freq: Once | INTRAMUSCULAR | Status: AC
  Administered 2018-08-29: 0.2 mg via INTRAMUSCULAR

## 2018-08-29 MED ORDER — BENZOCAINE-MENTHOL 20-0.5 % EX AERO
1.0000 "application " | INHALATION_SPRAY | CUTANEOUS | Status: DC | PRN
  Administered 2018-08-30: 1 via TOPICAL
  Filled 2018-08-29: qty 56

## 2018-08-29 MED ORDER — DIBUCAINE 1 % RE OINT: 1.0000 "application " | TOPICAL_OINTMENT | RECTAL | Status: DC | PRN

## 2018-08-29 MED ORDER — SIMETHICONE 80 MG PO CHEW: 80.0000 mg | CHEWABLE_TABLET | ORAL | Status: DC | PRN

## 2018-08-29 MED ORDER — DIPHENHYDRAMINE HCL 50 MG/ML IJ SOLN: 12.5000 mg | INTRAMUSCULAR | Status: DC | PRN

## 2018-08-29 MED ORDER — MISOPROSTOL 50MCG HALF TABLET
50.0000 ug | ORAL_TABLET | ORAL | Status: DC | PRN
  Administered 2018-08-29: 50 ug via ORAL
  Filled 2018-08-29: qty 1

## 2018-08-29 MED ORDER — WITCH HAZEL-GLYCERIN EX PADS: 1.0000 "application " | MEDICATED_PAD | CUTANEOUS | Status: DC | PRN

## 2018-08-29 NOTE — Anesthesia Postprocedure Evaluation (Signed)
Anesthesia Post Note  Patient: Kim Blair  Procedure(s) Performed: AN AD HOC LABOR EPIDURAL     Patient location during evaluation: Mother Baby Anesthesia Type: Epidural Level of consciousness: awake and alert Pain management: pain level controlled Vital Signs Assessment: post-procedure vital signs reviewed and stable Respiratory status: spontaneous breathing, nonlabored ventilation and respiratory function stable Cardiovascular status: stable Postop Assessment: no headache, no backache, epidural receding, patient able to bend at knees and no apparent nausea or vomiting Anesthetic complications: no    Last Vitals:  Vitals:   08/29/18 1209 08/29/18 1637  BP: 123/74 118/67  Pulse: 80 62  Resp: 20 18  Temp: 37.3 C 37.1 C  SpO2:      Last Pain:  Vitals:   08/29/18 1209  TempSrc: Oral  PainSc:    Pain Goal:                   Rica Records

## 2018-08-29 NOTE — Progress Notes (Signed)
Notified by RN that patient was having a trickle of bright red blood with clots; given that patient had brisk bleeding at delivery, will give methergine now and continue to monitor.  Patient hemodynamically stable, alert and oriented times 3.  RN will notify CNM if presence is requested, otherwise will transfer to MB after 20 minutes of observation.

## 2018-08-29 NOTE — Anesthesia Procedure Notes (Signed)
Epidural Patient location during procedure: OB Start time: 08/29/2018 4:45 AM End time: 08/29/2018 4:57 AM  Staffing Anesthesiologist: Lucretia Kern, MD Performed: anesthesiologist   Preanesthetic Checklist Completed: patient identified, pre-op evaluation, timeout performed, IV checked, risks and benefits discussed and monitors and equipment checked  Epidural Patient position: sitting Prep: DuraPrep Patient monitoring: heart rate, continuous pulse ox and blood pressure Approach: midline Location: L3-L4 Injection technique: LOR air  Needle:  Needle type: Tuohy  Needle gauge: 17 G Needle length: 9 cm Needle insertion depth: 5.5 cm Catheter type: closed end flexible Catheter size: 19 Gauge Catheter at skin depth: 11 cm Test dose: negative and 2% lidocaine with Epi 1:200 K  Assessment Events: blood not aspirated, injection not painful, no injection resistance, negative IV test and no paresthesia  Additional Notes Reason for block:procedure for pain

## 2018-08-29 NOTE — Anesthesia Preprocedure Evaluation (Signed)
Anesthesia Evaluation  Patient identified by MRN, date of birth, ID band Patient awake    Reviewed: Allergy & Precautions, H&P , NPO status , Patient's Chart, lab work & pertinent test results  History of Anesthesia Complications Negative for: history of anesthetic complications  Airway Mallampati: II  TM Distance: >3 FB Neck ROM: full    Dental no notable dental hx.    Pulmonary neg pulmonary ROS,    Pulmonary exam normal        Cardiovascular negative cardio ROS Normal cardiovascular exam Rhythm:regular Rate:Normal     Neuro/Psych negative neurological ROS  negative psych ROS   GI/Hepatic negative GI ROS, Neg liver ROS,   Endo/Other  Morbid obesity  Renal/GU negative Renal ROS  negative genitourinary   Musculoskeletal   Abdominal   Peds  Hematology negative hematology ROS (+)   Anesthesia Other Findings   Reproductive/Obstetrics (+) Pregnancy                             Anesthesia Physical Anesthesia Plan  ASA: III  Anesthesia Plan: Epidural   Post-op Pain Management:    Induction:   PONV Risk Score and Plan:   Airway Management Planned:   Additional Equipment:   Intra-op Plan:   Post-operative Plan:   Informed Consent: I have reviewed the patients History and Physical, chart, labs and discussed the procedure including the risks, benefits and alternatives for the proposed anesthesia with the patient or authorized representative who has indicated his/her understanding and acceptance.       Plan Discussed with:   Anesthesia Plan Comments:         Anesthesia Quick Evaluation  

## 2018-08-29 NOTE — Progress Notes (Signed)
LABOR PROGRESS NOTE  Kim Blair is a 28 y.o. G2P1001 at [redacted]w[redacted]d  admitted for IOL for decreased fetal movement and NRNST.   Subjective: Patient feeling more contractions but otherwise comfortable. FOB at bedside.   Objective: BP 131/73   Pulse 76   Temp 98.5 F (36.9 C) (Oral)   Ht 5' (1.524 m)   Wt 93.9 kg   LMP 11/20/2017   BMI 40.43 kg/m  or  Vitals:   08/28/18 1919 08/28/18 2022 08/28/18 2108 08/28/18 2300  BP: 127/70 119/73 (!) 111/59 131/73  Pulse: 77 72 72 76  Temp:  98.5 F (36.9 C)    TempSrc:  Oral    Weight:   93.9 kg   Height:   5' (1.524 m)     Dilation: Closed Effacement (%): 70 Presentation: Vertex Exam by:: Dr. Marcy Siren FHT: baseline rate 140, moderate varibility, +acel, no decel Toco: q3-5 min   Labs: Lab Results  Component Value Date   WBC 9.1 08/28/2018   HGB 11.9 (L) 08/28/2018   HCT 36.1 08/28/2018   MCV 90.9 08/28/2018   PLT 180 08/28/2018    Patient Active Problem List   Diagnosis Date Noted  . Post-dates pregnancy 08/28/2018  . GBS (group B Streptococcus carrier), +RV culture, currently pregnant 08/15/2018  . Language barrier 06/14/2018  . Sciatica of left side 05/06/2018  . Supervision of other normal pregnancy, antepartum 02/11/2018    Assessment / Plan: 28 y.o. G2P1001 at [redacted]w[redacted]d here for IOL for decreased fetal movement and NRNST.   Labor: Induction. Cervix remains closed. Will give Cytotec 50 mg PO.  Fetal Wellbeing:  Cat I  Pain Control:  Epidural upon maternal request.  Anticipated MOD:  NSVD   Marcy Siren, D.O. OB Fellow  08/29/2018, 12:14 AM

## 2018-08-29 NOTE — Lactation Note (Signed)
This note was copied from a baby's chart. Lactation Consultation Note  Patient Name: Kim Blair KPVVZ'S Date: 08/29/2018 Reason for consult: Initial assessment   Video interpreter used for Spanish.  P2, Baby 4 hours old.  Mother states she breastfed first child for one year with formula also. Baby sleeping in mother's arms. LC will follow up later today to help mother breastfeed.    Maternal Data    Feeding Feeding Type: Breast Fed  LATCH Score Latch: Grasps breast easily, tongue down, lips flanged, rhythmical sucking.  Audible Swallowing: Spontaneous and intermittent  Type of Nipple: Everted at rest and after stimulation  Comfort (Breast/Nipple): Soft / non-tender  Hold (Positioning): No assistance needed to correctly position infant at breast.  LATCH Score: 10  Interventions Interventions: Breast feeding basics reviewed  Lactation Tools Discussed/Used     Consult Status Consult Status: Follow-up Date: 08/30/18 Follow-up type: In-patient    [name] [facility] 08/29/2018, 12:02 PM

## 2018-08-29 NOTE — Progress Notes (Signed)
LABOR PROGRESS NOTE  Kim Blair is a 28 y.o. G2P1001 at [redacted]w[redacted]d  admitted for IOL for decreased fetal movement and NRNST.   Subjective: Strip note. Discussed plan of care with RN.   Objective: BP (!) 146/97   Pulse 70   Temp 97.9 F (36.6 C) (Oral)   Ht 5' (1.524 m)   Wt 93.9 kg   LMP 11/20/2017   BMI 40.43 kg/m  or  Vitals:   08/29/18 0015 08/29/18 0200 08/29/18 0300 08/29/18 0409  BP: 125/84  132/78 (!) 146/97  Pulse: 74  67 70  Temp:  97.9 F (36.6 C)    TempSrc:  Oral    Weight:      Height:        Dilation: 3.5 Effacement (%): 70 Presentation: Vertex Exam by:: Gearldine Bienenstock, RN FHT: baseline rate 130, moderate varibility, +acel, no decel Toco: q3-6 min   Labs: Lab Results  Component Value Date   WBC 9.1 08/28/2018   HGB 11.9 (L) 08/28/2018   HCT 36.1 08/28/2018   MCV 90.9 08/28/2018   PLT 180 08/28/2018    Patient Active Problem List   Diagnosis Date Noted  . Post-dates pregnancy 08/28/2018  . GBS (group B Streptococcus carrier), +RV culture, currently pregnant 08/15/2018  . Language barrier 06/14/2018  . Sciatica of left side 05/06/2018  . Supervision of other normal pregnancy, antepartum 02/11/2018    Assessment / Plan: 28 y.o. G2P1001 at [redacted]w[redacted]d here for IOL for decreased fetal movement and NRNST.   Labor: Induction. Patient s/p cytotec x2. SROM at 0210. Good cervical change on repeat exam. Will start Pit 2x2.  Fetal Wellbeing:  Cat I  Pain Control:  Epidural upon maternal request.  Anticipated MOD:  NSVD   Marcy Siren, D.O. OB Fellow  08/29/2018, 4:25 AM

## 2018-08-30 LAB — CBC WITH DIFFERENTIAL/PLATELET
Abs Immature Granulocytes: 0.01 10*3/uL (ref 0.00–0.07)
Basophils Absolute: 0 10*3/uL (ref 0.0–0.1)
Basophils Relative: 0 %
EOS PCT: 1 %
Eosinophils Absolute: 0.1 10*3/uL (ref 0.0–0.5)
HCT: 30.5 % — ABNORMAL LOW (ref 36.0–46.0)
Hemoglobin: 10.4 g/dL — ABNORMAL LOW (ref 12.0–15.0)
Immature Granulocytes: 0 %
Lymphocytes Relative: 39 %
Lymphs Abs: 3.6 10*3/uL (ref 0.7–4.0)
MCH: 30.9 pg (ref 26.0–34.0)
MCHC: 34.1 g/dL (ref 30.0–36.0)
MCV: 90.5 fL (ref 80.0–100.0)
MONOS PCT: 7 %
Monocytes Absolute: 0.6 10*3/uL (ref 0.1–1.0)
Neutro Abs: 4.9 10*3/uL (ref 1.7–7.7)
Neutrophils Relative %: 53 %
Platelets: 156 10*3/uL (ref 150–400)
RBC: 3.37 MIL/uL — ABNORMAL LOW (ref 3.87–5.11)
RDW: 13.7 % (ref 11.5–15.5)
WBC: 9.2 10*3/uL (ref 4.0–10.5)
nRBC: 0 % (ref 0.0–0.2)

## 2018-08-30 NOTE — Progress Notes (Addendum)
Post Partum Day 1  28 y.o. now G2P2 s/p NSVD at [redacted]w[redacted]d, who was admitted for IOL for decreased fetal movement and NRFHT.   Subjective: no complaints, up ad lib, voiding and tolerating PO Some HA that resolves on its own and denies SOB, RUQ pain, no LE pain or swelling. Is having some vulvar pain that is resolved with lido spray and she says that her pain medication helps adequately with this pain.   Objective: Blood pressure (!) 101/58, pulse 79, temperature 98.4 F (36.9 C), temperature source Oral, resp. rate 16, height 5' (1.524 m), weight 93.9 kg, last menstrual period 11/20/2017, SpO2 99 %, unknown if currently breastfeeding.  Physical Exam:  General: alert, cooperative and no distress  Lochia: appropriate Uterine Fundus: firm DVT Evaluation: No cords or calf tenderness. No significant calf/ankle edema.  Recent Labs    08/28/18 1930 08/30/18 0826  HGB 11.9* 10.4*  HCT 36.1 30.5*    Assessment/Plan: Plan for discharge tomorrow pending her infant, she had some Cytotec PR and Methergine post delivery and there is minimal  post delivery loss, she is having some dizziness today but is not tachycardic and her blood pressures are wnl post partum. Post partum rpt CBC 11.9>10.4. no which is not currently alarming will continue to assess loquia tmro.    LOS: 2 days   Sandi Raveling 08/30/2018, 9:47 AM

## 2018-08-30 NOTE — Lactation Note (Addendum)
This note was copied from a baby's chart. Lactation Consultation Note  Patient Name: Girl Aubreeana Wahid NBVAP'O Date: 08/30/2018   Baby girl Sehnert now 72 hours old. Used interpreter on a stick-Oscar. Mom reports she recently started formula because infant was hungry after breastfeeding.  Mom reports that she does not have any milk.  Asked mom if she had tried to hand express and she reports yes.  Mom reports the nurse got something yesterday but when she tried today she did not get anything.  Asked mom if I could try.  Mom reported yes.  Able to easily hand express colostrum from moms right breast. Asked mom to demo on left and mom able to easily hand express colostrum.  Fed infant 1 tsp of colostrum. Infant in crib.  Recently fed formula. Infant showing no hunger cues. Urged mom to d/c formula and hand express and fed colostrum past breastfeedings via spoon. Mom and dad in agreement.  Reviewed how colostrum digested quickly.  Urged to feed on cue and 8 or more times day.  Mom breasts and nipples wnl/intact. Praised breastfeeding. Urged mom to call lactation as needed.   Maternal Data    Feeding    LATCH Score                   Interventions    Lactation Tools Discussed/Used     Consult Status      Shanika Levings Michaelle Copas 08/30/2018, 1:18 PM

## 2018-08-30 NOTE — Progress Notes (Addendum)
Used interpreter [phone] to explained about WIC and how to contact them, also to throw away formula after an hour. Pt verbalized understanding.

## 2018-08-30 NOTE — Progress Notes (Signed)
Assess pt, went over POC, addressed questions and concerns via interpreter [phone]

## 2018-08-30 NOTE — Progress Notes (Signed)
I have reviewed this chart and agree with the RN/CMA assessment and management.    Karina Nofsinger C Nelida Mandarino, MD, FACOG Attending Physician, Faculty Practice [facility]  

## 2018-08-31 ENCOUNTER — Inpatient Hospital Stay (HOSPITAL_COMMUNITY): Payer: Medicaid Other

## 2018-08-31 DIAGNOSIS — O480 Post-term pregnancy: Secondary | ICD-10-CM

## 2018-08-31 DIAGNOSIS — R748 Abnormal levels of other serum enzymes: Secondary | ICD-10-CM

## 2018-08-31 DIAGNOSIS — O99824 Streptococcus B carrier state complicating childbirth: Secondary | ICD-10-CM

## 2018-08-31 DIAGNOSIS — O99019 Anemia complicating pregnancy, unspecified trimester: Secondary | ICD-10-CM | POA: Diagnosis present

## 2018-08-31 DIAGNOSIS — Z3A40 40 weeks gestation of pregnancy: Secondary | ICD-10-CM

## 2018-08-31 LAB — PROTEIN / CREATININE RATIO, URINE
CREATININE, URINE: 55.52 mg/dL
Protein Creatinine Ratio: 0.22 mg/mg{Cre} — ABNORMAL HIGH (ref 0.00–0.15)
Total Protein, Urine: 12 mg/dL

## 2018-08-31 LAB — COMPREHENSIVE METABOLIC PANEL
ALT: 65 U/L — ABNORMAL HIGH (ref 0–44)
ALT: 121 U/L — ABNORMAL HIGH (ref 0–44)
AST: 127 U/L — AB (ref 15–41)
AST: 251 U/L — ABNORMAL HIGH (ref 15–41)
Albumin: 2.7 g/dL — ABNORMAL LOW (ref 3.5–5.0)
Albumin: 2.9 g/dL — ABNORMAL LOW (ref 3.5–5.0)
Alkaline Phosphatase: 86 U/L (ref 38–126)
Alkaline Phosphatase: 99 U/L (ref 38–126)
Anion gap: 6 (ref 5–15)
Anion gap: 9 (ref 5–15)
BUN: 10 mg/dL (ref 6–20)
BUN: 12 mg/dL (ref 6–20)
CHLORIDE: 105 mmol/L (ref 98–111)
CO2: 24 mmol/L (ref 22–32)
CO2: 26 mmol/L (ref 22–32)
Calcium: 8.7 mg/dL — ABNORMAL LOW (ref 8.9–10.3)
Calcium: 9.2 mg/dL (ref 8.9–10.3)
Chloride: 109 mmol/L (ref 98–111)
Creatinine, Ser: 0.47 mg/dL (ref 0.44–1.00)
Creatinine, Ser: 0.63 mg/dL (ref 0.44–1.00)
GFR calc Af Amer: >60 mL/min (ref >60)
GFR calc Af Amer: >60 mL/min (ref >60)
GFR calc non Af Amer: >60 mL/min (ref >60)
Glucose, Bld: 78 mg/dL (ref 70–99)
Glucose, Bld: 111 mg/dL — ABNORMAL HIGH (ref 70–99)
Potassium: 3.6 mmol/L (ref 3.5–5.1)
Potassium: 4 mmol/L (ref 3.5–5.1)
Sodium: 139 mmol/L (ref 135–145)
Sodium: 140 mmol/L (ref 135–145)
Total Bilirubin: 0.4 mg/dL (ref 0.3–1.2)
Total Bilirubin: 0.5 mg/dL (ref 0.3–1.2)
Total Protein: 5.4 g/dL — ABNORMAL LOW (ref 6.5–8.1)
Total Protein: 6.1 g/dL — ABNORMAL LOW (ref 6.5–8.1)

## 2018-08-31 LAB — CBC WITH DIFFERENTIAL/PLATELET
Abs Immature Granulocytes: 0.01 10*3/uL (ref 0.00–0.07)
Basophils Absolute: 0 10*3/uL (ref 0.0–0.1)
Basophils Relative: 0 %
EOS ABS: 0.1 10*3/uL (ref 0.0–0.5)
EOS PCT: 2 %
HCT: 32.2 % — ABNORMAL LOW (ref 36.0–46.0)
Hemoglobin: 10.2 g/dL — ABNORMAL LOW (ref 12.0–15.0)
Immature Granulocytes: 0 %
Lymphocytes Relative: 27 %
Lymphs Abs: 2 10*3/uL (ref 0.7–4.0)
MCH: 29.1 pg (ref 26.0–34.0)
MCHC: 31.7 g/dL (ref 30.0–36.0)
MCV: 91.7 fL (ref 80.0–100.0)
Monocytes Absolute: 0.4 10*3/uL (ref 0.1–1.0)
Monocytes Relative: 6 %
Neutro Abs: 5 10*3/uL (ref 1.7–7.7)
Neutrophils Relative %: 65 %
PLATELETS: 185 10*3/uL (ref 150–400)
RBC: 3.51 MIL/uL — ABNORMAL LOW (ref 3.87–5.11)
RDW: 13.8 % (ref 11.5–15.5)
WBC: 7.7 10*3/uL (ref 4.0–10.5)
nRBC: 0 % (ref 0.0–0.2)

## 2018-08-31 LAB — CBC
HEMATOCRIT: 30.4 % — AB (ref 36.0–46.0)
HEMOGLOBIN: 10 g/dL — AB (ref 12.0–15.0)
MCH: 29.9 pg (ref 26.0–34.0)
MCHC: 32.9 g/dL (ref 30.0–36.0)
MCV: 91 fL (ref 80.0–100.0)
Platelets: 172 10*3/uL (ref 150–400)
RBC: 3.34 MIL/uL — ABNORMAL LOW (ref 3.87–5.11)
RDW: 13.5 % (ref 11.5–15.5)
WBC: 8.2 10*3/uL (ref 4.0–10.5)
nRBC: 0 % (ref 0.0–0.2)

## 2018-08-31 MED ORDER — ACETAMINOPHEN 500 MG PO TABS: 1000.0000 mg | ORAL_TABLET | Freq: Once | ORAL | Status: DC

## 2018-08-31 NOTE — Progress Notes (Signed)
Post Partum Day 2 Subjective: up ad lib, voiding and tolerating PO - Reports that she has a headache on the right side of her head that started around 3am; no history of chronic headache outside of pregnancy  - Reports right sided abdominal pain that also started at 3am - Also reports vision changes including seeing smoke that is not there and spots in her vision that started yesterday  - no history of HTN during or outside of pregnancy  Objective: Blood pressure 115/63, pulse 72, temperature (!) 97.4 F (36.3 C), temperature source Oral, resp. rate 17, height 5' (1.524 m), weight 93.9 kg, last menstrual period 11/20/2017, SpO2 97 %, unknown if currently breastfeeding.  Physical Exam:  General: alert, cooperative, appears stated age and no distress Lochia: appropriate Uterine Fundus: firm Incision: NA DVT Evaluation: No evidence of DVT seen on physical exam.  Recent Labs    08/30/18 0826 08/31/18 0808  HGB 10.4* 10.0*  HCT 30.5* 30.4*    Assessment/Plan: -CBC and CMP ordered this morning with elevated AST and ALT, however BP normal now and during pregnancy -Will treat HA, monitor blood pressures and repeat labs this afternoon -Consider magnesium if HA does not go away or other concerns for preE with severe features   LOS: 3 days   Gwenevere Abbot 08/31/2018, 9:16 AM

## 2018-08-31 NOTE — Lactation Note (Signed)
This note was copied from a baby's chart. Lactation Consultation Note  Patient Name: Kim Blair Date: 08/31/2018 Reason for consult: Follow-up assessment;Term;Infant weight loss  51 hours old FT female who is being partially BF and formula fed by her mother, she's a P2. Mom and baby are going home today, baby is at 1% weight loss. Reviewed engorgement prevention and treatment and treatment and prevention for sore nipples. LC noticed that there was an old bottle of Gerber formula sitting on the counter, educated parents about formula storage guidelines. Mom also asked about how much formula she should give to her baby to supplement at the breast. LC reviewed formula supplementation guidelines, baby is getting large amount of formula, last feeding was 45 ml. Discussed red flags on when to call baby's pediatrician. Parents reported all questions and concerns were answered, they're both aware of LC OP services and will call contact PRN.  Maternal Data    Feeding   Interventions Interventions: Breast feeding basics reviewed  Lactation Tools Discussed/Used     Consult Status Consult Status: Complete Date: 08/31/18 Follow-up type: In-patient    Aradia Estey Venetia Constable 08/31/2018, 11:32 AM

## 2018-08-31 NOTE — Progress Notes (Signed)
Labs reviewed. Liver enzymes elevated higher than this am. Patient reports HA is now a 1/10 after treatment. Consulted with Dr. Shawnie Pons. Will draw hepatitis panel, do protein/creatanine ratio with cath urine and ultrasound of right upper quadrant. Plan of care reviewed with patient.   Rolm Bookbinder, CNM 08/31/18 3:41 PM

## 2018-08-31 NOTE — Discharge Summary (Signed)
Postpartum Discharge Summary     Patient Name: Kim Blair DOB: 09-09-1990 MRN: [id number]  Date of admission: 08/28/2018 Delivering Provider: Arvilla Market   Date of discharge: 09/01/2018  Admitting diagnosis: pregnancy Intrauterine pregnancy: [redacted]w[redacted]d     Secondary diagnosis:  Principal Problem:   Post-dates pregnancy Active Problems:   GBS (group B Streptococcus carrier), +RV culture, currently pregnant   Anemia affecting pregnancy   Elevated liver enzymes  Additional problems:      Discharge diagnosis: Term Pregnancy Delivered                                                                                                Post partum procedures:None  Augmentation: Pitocin and Cytotec  Complications: None  Hospital course:  Induction of Labor With Vaginal Delivery   28 y.o. yo G2P2002 at [redacted]w[redacted]d was admitted to the hospital 08/28/2018 for induction of labor.  Indication for induction: Postdates and NRNST.  Patient had an uncomplicated labor course as follows: Membrane Rupture Time/Date: 2:10 AM ,08/29/2018   Intrapartum Procedures: Episiotomy: None [1]                                         Lacerations:  2nd degree [3]  Patient had delivery of a Viable infant.  Information for the patient's newborn:  Peyton, Keefauver Girl Amyree [[id number]]      On postpartum day 2, she began to have right sided abdominal pain, headache and vision changes. Her platelets were normal, but liver enzymes were elevated. Her blood pressures were also normal. Repeat liver enzymes were double prior check. RUQ ultrasound showed likely diffuse hepatic steatosis. Acute hepatitis panel was pending at the time of discharge. Blood pressures remained normal throughout her hospital stay and her headache and vision changes resolved prior to discharge. She was determined to be safe for discharge on postpartum day 3.  A 1 week follow up visit was scheduled for a blood pressure check and baby love nurse  visit was also ordered.   08/29/2018  Details of delivery can be found in separate delivery note.  Patient had a routine postpartum course. Patient is discharged home 09/01/18.  Magnesium Sulfate recieved: No BMZ received: No  Physical exam  Vitals:   08/31/18 0621 08/31/18 1456 08/31/18 2141 09/01/18 0514  BP: 115/63 118/70 121/67 117/65  Pulse: 72 82 70 71  Resp: 17 17 18 16   Temp: (!) 97.4 F (36.3 C) 98.1 F (36.7 C) 98.4 F (36.9 C) 98.2 F (36.8 C)  TempSrc: Oral Oral Oral Oral  SpO2: 97% 99% 100% 100%  Weight:      Height:       General: alert, cooperative and no distress Lochia: appropriate Uterine Fundus: firm Incision: N/A DVT Evaluation: No evidence of DVT seen on physical exam. Labs: Lab Results  Component Value Date   WBC 7.7 08/31/2018   HGB 10.2 (L) 08/31/2018   HCT 32.2 (L) 08/31/2018   MCV 91.7 08/31/2018   PLT 185 08/31/2018  CMP Latest Ref Rng & Units 08/31/2018  Glucose 70 - 99 mg/dL 093(O)  BUN 6 - 20 mg/dL 12  Creatinine 6.71 - 2.45 mg/dL 8.09  Sodium 983 - 382 mmol/L 140  Potassium 3.5 - 5.1 mmol/L 4.0  Chloride 98 - 111 mmol/L 105  CO2 22 - 32 mmol/L 26  Calcium 8.9 - 10.3 mg/dL 9.2  Total Protein 6.5 - 8.1 g/dL 6.1(L)  Total Bilirubin 0.3 - 1.2 mg/dL 0.5  Alkaline Phos 38 - 126 U/L 99  AST 15 - 41 U/L 251(H)  ALT 0 - 44 U/L 121(H)    Discharge instruction: per After Visit Summary and "Baby and Me Booklet".  After visit meds:  Allergies as of 09/01/2018   No Known Allergies     Medication List    TAKE these medications   Concept OB 130-92.4-1 MG Caps Take 1 capsule by mouth daily.   ibuprofen 600 MG tablet Commonly known as:  ADVIL,MOTRIN Take 1 tablet (600 mg total) by mouth every 6 (six) hours.       Diet: low salt diet  Activity: Advance as tolerated. Pelvic rest for 6 weeks.   Outpatient follow up:1 week office visit Follow up Appt:No future appointments. Follow up Visit:   Newborn Data: Live born female   Birth Weight: 6 lb 7.7 oz (2940 g) APGAR: 9, 9  Newborn Delivery   Birth date/time:  08/29/2018 07:35:00 Delivery type:  Vaginal, Spontaneous     Baby Feeding: Both Disposition:home with mother   09/01/2018 Gwenevere Abbot, MD

## 2018-08-31 NOTE — Progress Notes (Signed)
Order for ultrasound and urine reviewed and explained via [employer] [phone]. U/S per U/S tech cannot be done unless patient has been NPO for 6 hours except for water. Patient last ate at 315pm and u/s to be done at 915 pm. Patient and  Her husband verbalized understanding of this.

## 2018-09-01 LAB — HEPATITIS PANEL, ACUTE
HCV Ab: <0.1 s/co ratio (ref 0.0–0.9)
HEP B S AG: NEGATIVE
Hep A IgM: NEGATIVE
Hep B C IgM: NEGATIVE

## 2018-09-01 MED ORDER — IBUPROFEN 600 MG PO TABS: 600.0000 mg | ORAL_TABLET | Freq: Four times a day (QID) | ORAL | 0 refills | Status: AC

## 2018-09-01 NOTE — Lactation Note (Signed)
This note was copied from a baby's chart. Lactation Consultation Note  Patient Name: Kim Blair [id number] Date: 09/01/2018 Reason for consult: Follow-up assessment   Video Interpreter used for Spanish.  Baby 73 hours old. Mother c/o cracks on tips of nipples. Provided mother with coconut oil and observed latch. Mother hand expressed good flow of transitional breastmilk.  Breasts are filling.  Unwrapped baby for feeding.  Demonstrated how to perform chin tug to wide latch and compress breast during feeding to empty. Encouraged breastfeeding before offering formula. Answered questions about pumping and milk storage. Mother has manual pump. Feed on demand approximately 8-12 times per day.   Reviewed engorgement care and monitoring voids/stools.    Maternal Data    Feeding Feeding Type: Breast Fed  LATCH Score Latch: Grasps breast easily, tongue down, lips flanged, rhythmical sucking.  Audible Swallowing: A few with stimulation  Type of Nipple: Everted at rest and after stimulation  Comfort (Breast/Nipple): Filling, red/small blisters or bruises, mild/mod discomfort  Hold (Positioning): No assistance needed to correctly position infant at breast.  LATCH Score: 8  Interventions Interventions: Breast feeding basics reviewed;Coconut oil  Lactation Tools Discussed/Used     Consult Status Consult Status: Complete Date: 09/01/18    [name] [facility] 09/01/2018, 9:08 AM

## 2018-09-02 ENCOUNTER — Telehealth: Payer: Self-pay | Admitting: Family Medicine

## 2018-09-02 NOTE — Telephone Encounter (Signed)
Attempted to contact patient with Spanish interpreter  Kim Blair to inform patient of her upcoming appointments. No answer, lvm with appointment dates and times. Reminder letters also mailed.

## 2018-09-03 ENCOUNTER — Inpatient Hospital Stay (HOSPITAL_COMMUNITY): Payer: Medicaid Other

## 2018-09-05 ENCOUNTER — Ambulatory Visit (INDEPENDENT_AMBULATORY_CARE_PROVIDER_SITE_OTHER): Payer: Self-pay | Admitting: *Deleted

## 2018-09-05 ENCOUNTER — Other Ambulatory Visit: Payer: Self-pay

## 2018-09-05 DIAGNOSIS — Z0130 Encounter for examination of blood pressure without abnormal findings: Secondary | ICD-10-CM

## 2018-09-05 NOTE — Progress Notes (Signed)
Addendum: due to epic down unable to chart when patient in  Office at 9:20 this am. Charting at 10:50: Here for blood pressure check. Had IOL 3/5 with Vaginal delivery for postdates, NRFHR. With elevated liver enzymes.  BP today wnl , denies edema, headaches.  C/o intermittent pain, weakness right knee to hip = 5 and states fell to knee Monday.c/o intermittent tingling in left knee. States taking ibuprofen which helps her pain .States the tingling is improving.  She did have an epidural.  We discussed since she had a vaginal delivery and was in stirrups sometimes can have muscular issues, etc but will usually improve with time. Advised to call us if it gets worse; and to keep postpartum appointment already scheduled. I also discussed with Dr. Debroah Loop and he agrees with plan of care. She voices understanding.  Linda,RN

## 2018-09-05 NOTE — Progress Notes (Signed)
Patient ID: Kim Blair, female   DOB: [birth year], 29 y.o.   MRN: [id number] I have reviewed the chart and agree with nursing staff's documentation of this patient's encounter.  Scheryl Darter, MD 09/05/2018 5:10 PM

## 2018-09-16 ENCOUNTER — Encounter: Payer: Self-pay | Admitting: *Deleted

## 2018-09-27 ENCOUNTER — Other Ambulatory Visit: Payer: Self-pay

## 2018-09-27 ENCOUNTER — Ambulatory Visit (INDEPENDENT_AMBULATORY_CARE_PROVIDER_SITE_OTHER): Payer: Self-pay | Admitting: Advanced Practice Midwife

## 2018-09-27 ENCOUNTER — Encounter: Payer: Self-pay | Admitting: Advanced Practice Midwife

## 2018-09-27 VITALS — BP 125/86 | HR 76 | Temp 97.9°F | Ht <= 58 in | Wt 187.4 lb

## 2018-09-27 DIAGNOSIS — Z30011 Encounter for initial prescription of contraceptive pills: Secondary | ICD-10-CM

## 2018-09-27 MED ORDER — NORETHINDRONE 0.35 MG PO TABS: 1.0000 | ORAL_TABLET | Freq: Every day | ORAL | 11 refills | Status: AC

## 2018-09-27 NOTE — Progress Notes (Signed)
Subjective:     Kim Blair is a 28 y.o. female who presents for a postpartum visit. She is 4 weeks postpartum following a spontaneous vaginal delivery. I have fully reviewed the prenatal and intrapartum course. The delivery was at 40/2 gestational weeks. Outcome: spontaneous vaginal delivery. Anesthesia: epidural. Postpartum course has been un complicated. Baby's course has been uncomlicated. Baby is feeding by breast. Bleeding thin lochia. Bowel function is abnormal: little constipated. Bladder function is . Patient is not sexually active. Contraception method is none. Postpartum depression screening: negative.  The following portions of the patient's history were reviewed and updated as appropriate: allergies, current medications, past family history, past medical history, past social history, past surgical history and problem list.  Review of Systems Pertinent items are noted in HPI.  Denies abd pain, fever  Objective:    BP 125/86   Pulse 76   Temp 97.9 F (36.6 C)   Ht 4\' 8"  (1.422 m)   Wt 187 lb 6.4 oz (85 kg)   LMP 11/20/2017   Breastfeeding Yes   BMI 42.01 kg/m   General:  alert, cooperative, appears stated age, no distress and moderately obese   Breasts:  declined  Lungs: normal effort and rate  Heart:  regular  Abdomen: soft, non-tender; bowel sounds normal; no masses,  no organomegaly   Vulva:  declined  Rectal Exam: Not performed.        Assessment:     Normal  postpartum exam. Pap smear not done at today's visit.   Plan:    1. Contraception: IUD--Doesn't have insurance/Medicaid. POPs ordered. Instructed that she can go to [facility] or [facility] for [facility]UD.  2. Pap due 2022 3. Follow up in 1 year for annual [name], [name], [name] 09/27/2018 11:20 AM

## 2018-09-27 NOTE — Patient Instructions (Signed)
Colocacin de un dispositivo intrauterino, cuidados posteriores  Intrauterine Device Insertion, Care After    Lea esta informacin sobre cmo cuidarse despus del procedimiento. El mdico tambin podr darle instrucciones ms especficas. Comunquese con su mdico si tiene problemas o preguntas.  Qu puedo esperar despus del procedimiento?  Despus del procedimiento, es comn tener los siguientes sntomas:   Dolor y clicos abdominales.   Sangrado leve (manchado) o sangrado ms abundante, similar al perodo menstrual. Esto puede durar algunos das.   Dolor en la parte inferior de la espalda.   Mareos.   Dolores de cabeza.   Nuseas.  Siga estas indicaciones en su casa:   Antes de tener actividad sexual nuevamente, revise la zona y asegrese de poder tocar los hilos del DIU. Debe poder tocar el extremo de los hilos por debajo de la abertura del cuello uterino. Si el hilo del DIU est en su lugar, puede retomar la actividad sexual.  ? Si le colocaron un DIU hormonal despus de que hayan pasado 7das del inicio del perodo menstrual ms reciente, necesitar usar un mtodo anticonceptivo adicional durante los 7das posteriores a la colocacin del DIU. Pregntele al mdico si esto es vlido para su caso.   Siga controlando que el DIU est en su lugar; para ello, toque los hilos despus de cada perodo menstrual o una vez al mes.   Tome los medicamentos de venta libre y los recetados solamente como se lo haya indicado el mdico.   No conduzca ni use maquinaria pesada mientras toma analgsicos recetados.   Concurra a todas las visitas de control como se lo haya indicado el mdico. Esto es importante.  Comunquese con un mdico si:   Tiene un sangrado ms abundante o dura ms de un ciclo menstrual normal.   Tiene fiebre.   Tiene clicos o un dolor abdominal que empeora o que no mejora con los medicamentos.   Siente un dolor abdominal nuevo o que no est en la misma zona en la que sinti antes los clicos  y el dolor.   Se siente mareada o dbil.   Le sale una secrecin anormal o con mal olor de la vagina.   Siente dolor durante la actividad sexual.   Tiene cualquiera de los siguientes problemas con los hilos del DIU:  ? El hilo le molesta o lo lastima a usted o a su pareja sexual.  ? No puede tocar el hilo.  ? El hilo se ha alargado.   Puede sentir el DIU en la vagina.   Cree que puede estar embarazada o no tiene su perodo menstrual.   Cree que puede tener una ITS (infeccin de transmisin sexual).  Solicite ayuda de inmediato si:   Tiene sntomas similares a los de la gripe.   Siente escalofros o tiene fiebre.   Puede sentir que el DIU se ha salido de lugar.  Resumen   Despus del procedimiento, es comn tener dolor y clicos en el abdomen. Tambin es comn tener un sangrado leve (manchado) o un sangrado ms abundante, similar al perodo menstrual.   Siga controlando que el DIU est en su lugar; para ello, toque los hilos despus de cada perodo menstrual o una vez al mes.   Concurra a todas las visitas de control como se lo haya indicado el mdico. Esto es importante.   Comunquese con el mdico si tiene problemas con los hilos del DIU, por ejemplo, si el hilo se alarga o si le resulta molesto a usted o a   su pareja sexual durante la actividad sexual.  Esta informacin no tiene como fin reemplazar el consejo del mdico. Asegrese de hacerle al mdico cualquier pregunta que tenga.  Document Released: 03/06/2012 Document Revised: 03/15/2017 Document Reviewed: 03/15/2017  Elsevier Interactive Patient Education  2019 [employer]

## 2019-01-31 IMAGING — US US MFM OB FOLLOW-UP
1 series · 14 of 28 positions shown · non-contrast
Comparison: none

[Series 1: us mfm ob follow-up · 14 of 57 slices shown]
[im 3/57]
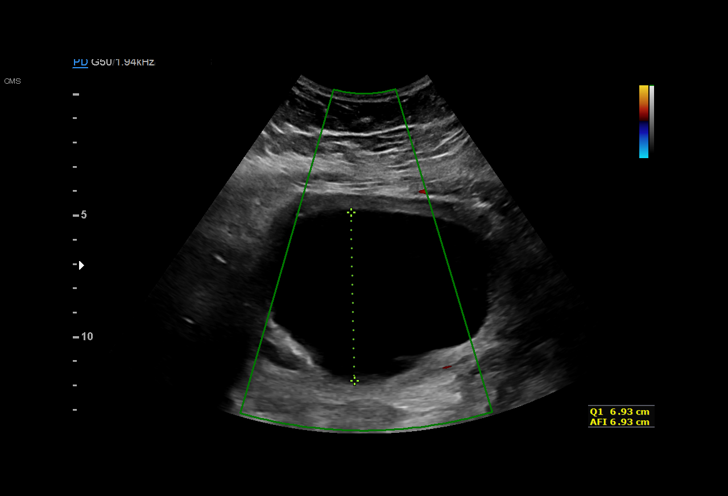
[im 7/57]
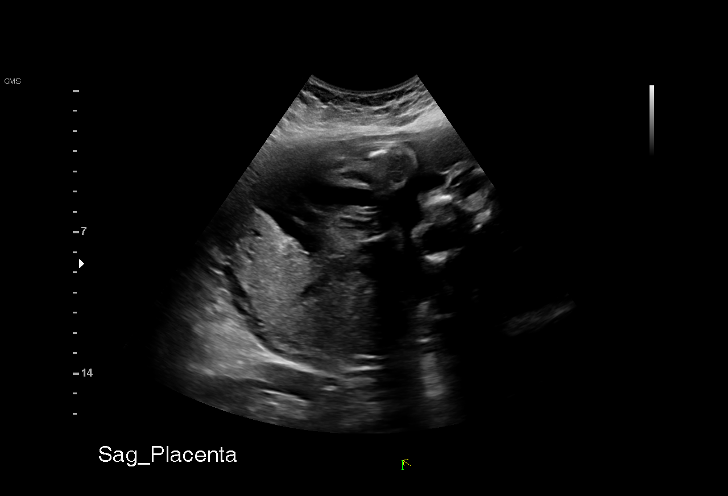
[im 11/57]
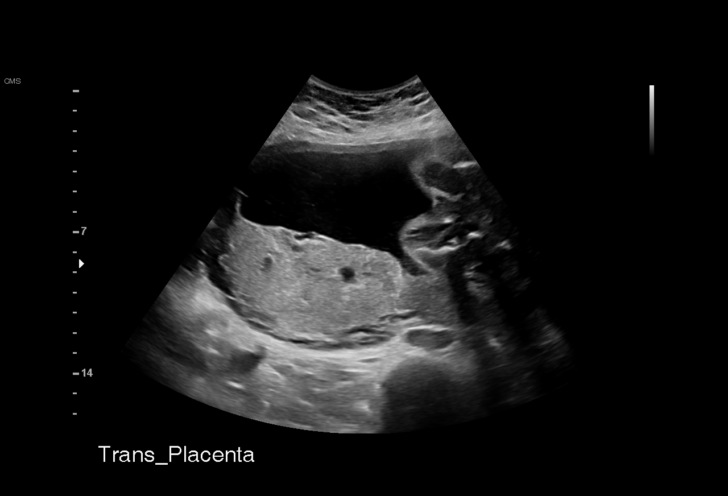
[im 15/57]
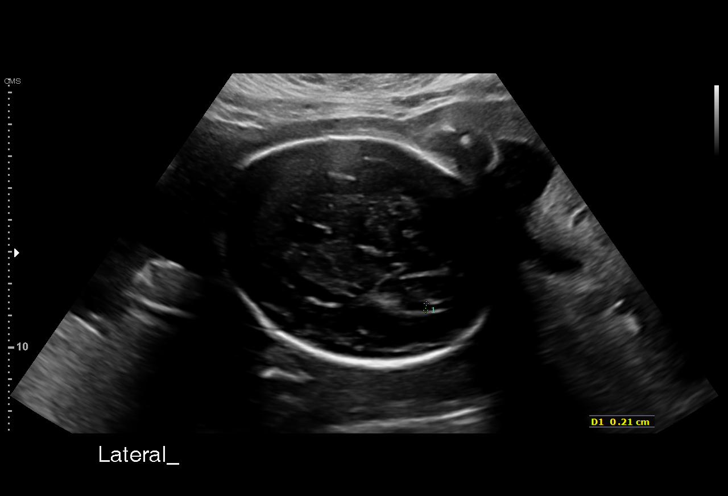
[im 19/57]
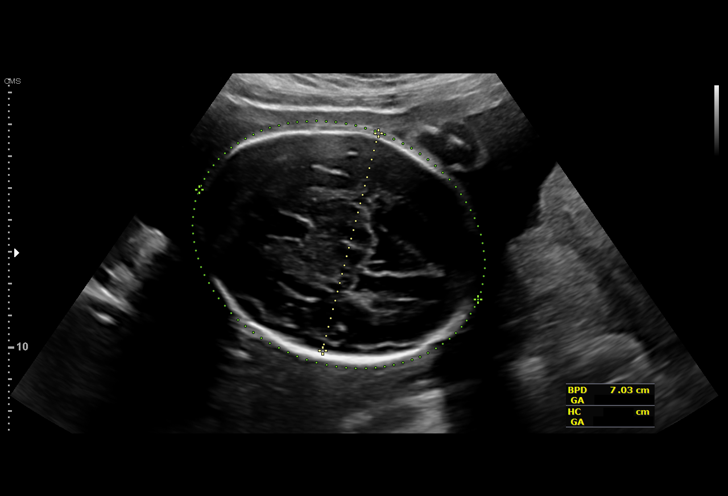
[im 23/57]
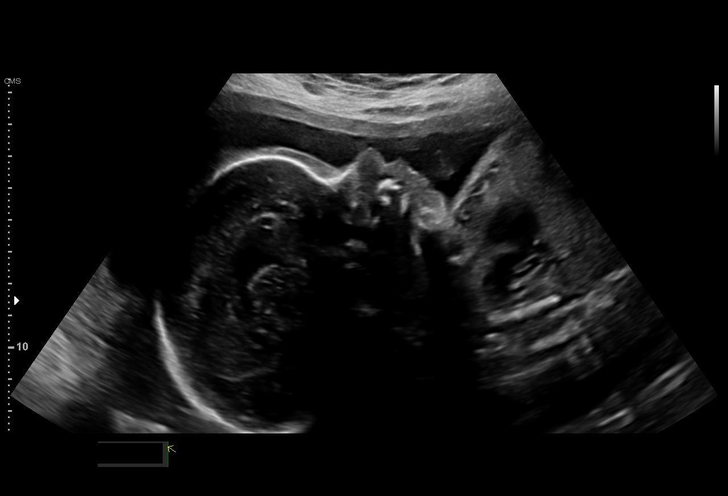
[im 27/57]
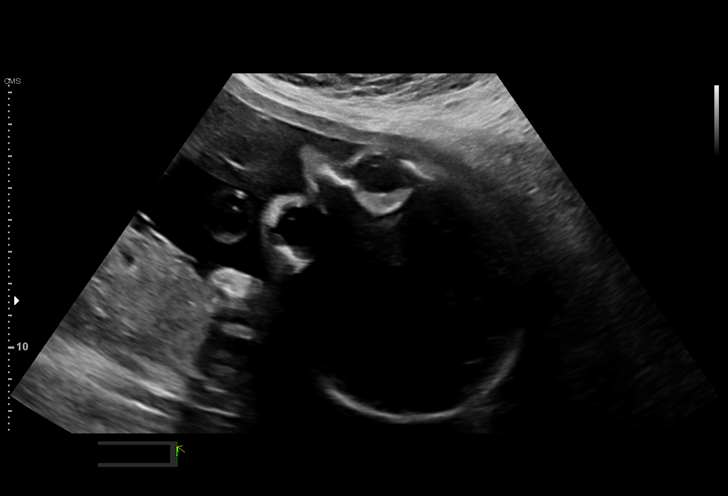
[im 32/57]
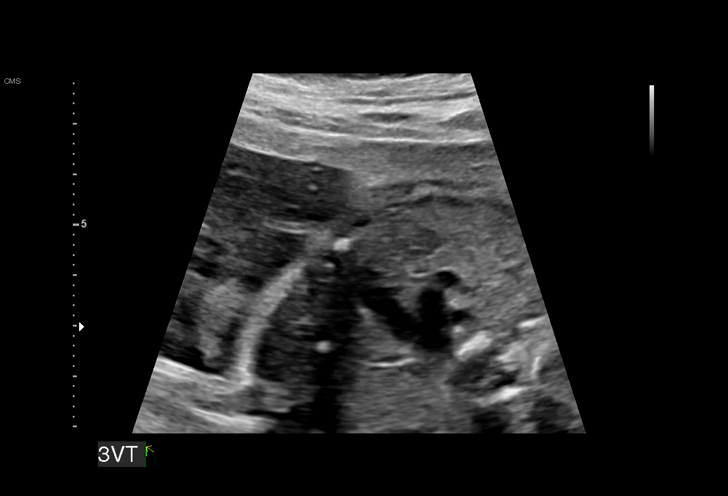
[im 36/57]
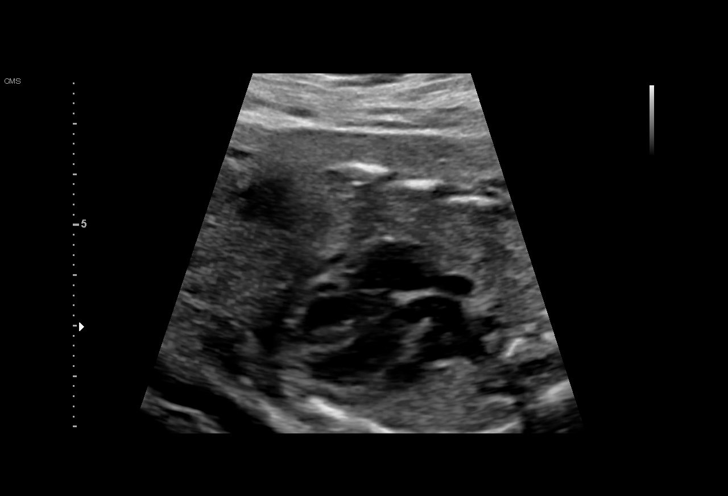
[im 40/57]
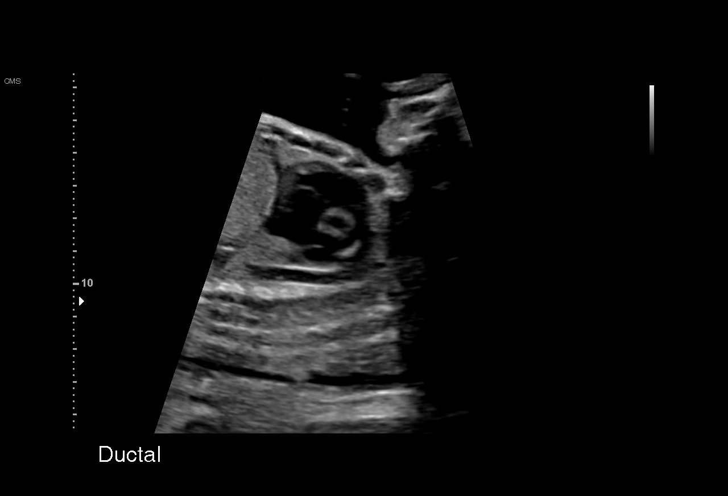
[im 44/57]
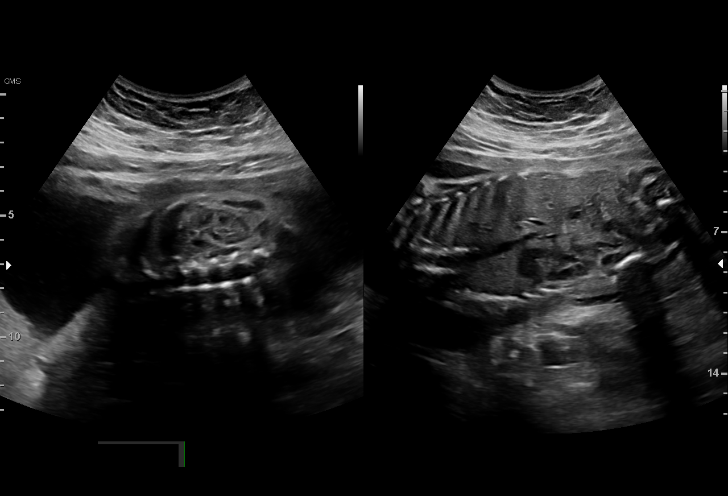
[im 48/57]
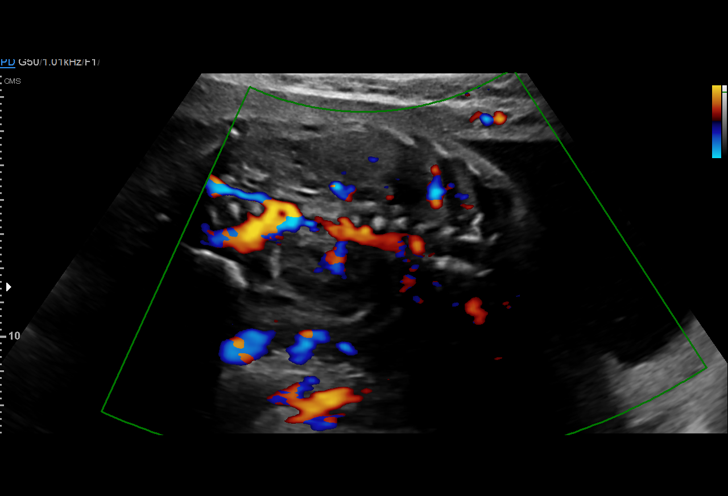
[im 52/57]
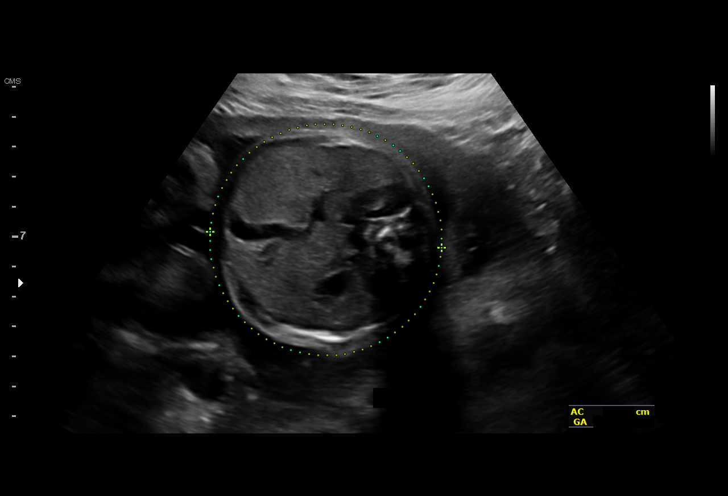
[im 57/57]
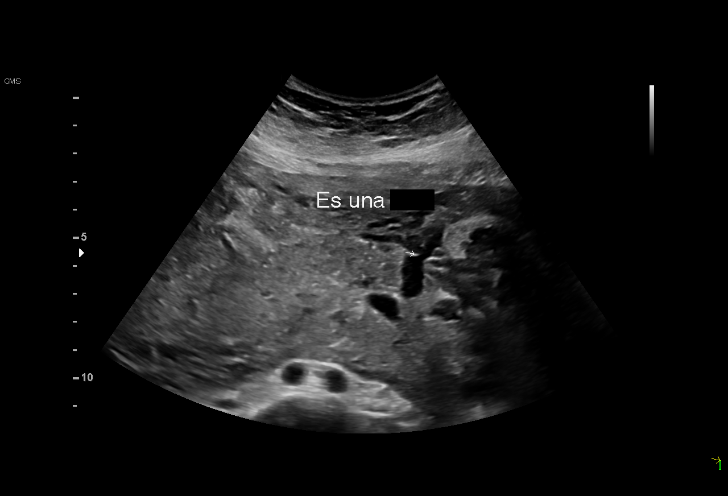

[14 of 28 positions shown; findings below may reference images not displayed]

OB/Gyn Clinic

 ----------------------------------------------------------------------

 ----------------------------------------------------------------------
Indications

  28 weeks gestation of pregnancy
  Antenatal follow-up for nonvisualized fetal
  anatomy
  Obesity complicating pregnancy, third
  trimester
 ----------------------------------------------------------------------
Vital Signs

                                                Height:        4'10"
Fetal Evaluation

 Num Of Fetuses:          1
 Fetal Heart Rate(bpm):   161
 Cardiac Activity:        Observed
 Presentation:            Cephalic
 Placenta:                Posterior
 P. Cord Insertion:       Previously Visualized

 Amniotic Fluid
 AFI FV:      Within normal limits

 AFI Sum(cm)     %Tile       Largest Pocket(cm)
 20.6            82

 RUQ(cm)       RLQ(cm)       LUQ(cm)        LLQ(cm)

Biometry

 BPD:      70.1  mm     G. Age:  28w 1d         18  %    CI:          71.4  %    70 - 86
                                                         FL/HC:       18.6  %    19.6 -
 HC:      264.2  mm     G. Age:  28w 5d         17  %    HC/AC:       1.10       0.99 -
 AC:      240.8  mm     G. Age:  28w 3d         29  %    FL/BPD:      70.0  %    71 - 87
 FL:       49.1  mm     G. Age:  26w 4d        < 3  %    FL/AC:       20.4  %    20 - 24
 HUM:      45.5  mm     G. Age:  26w 6d        < 5  %

 Est. FW:    6634   gm     2 lb 8 oz     30  %
OB History

 Gravidity:    2         Term:   1
Gestational Age

 LMP:           28w 6d        Date:  11/20/17                 EDD:   08/27/18
 U/S Today:     28w 0d                                        EDD:   09/02/18
 Best:          28w 6d     Det. By:  LMP  (11/20/17)          EDD:   08/27/18
Anatomy

 Cranium:               Appears normal         LVOT:                   Appears normal
 Cavum:                 Appears normal         Aortic Arch:            Appears normal
 Ventricles:            Appears normal         Ductal Arch:            Appears normal
 Choroid Plexus:        Appears normal         Diaphragm:              Previously seen
 Cerebellum:            Previously seen        Stomach:                Appears normal, left
                                                                       sided
 Posterior Fossa:       Previously seen        Abdomen:                Appears normal
 Nuchal Fold:           Previously seen        Abdominal Wall:         Previously seen
 Face:                  Appears normal         Cord Vessels:           Appears normal (3
                        (orbits and profile)                           vessel cord)
 Lips:                  Appears normal         Kidneys:                Appear normal
 Palate:                Not well visualized    Bladder:                Appears normal
 Thoracic:              Appears normal         Spine:                  Previously seen
 Heart:                 Appears normal         Upper Extremities:      Appears normal
                        (4CH, axis, and
                        situs)
 RVOT:                  Appears normal         Lower Extremities:      Appears normal

 Other:  Heels previously visualized. Nasal bone visualized. Technically
         difficult due to maternal habitus and fetal position.
Cervix Uterus Adnexa

 Cervix
 Not visualized (advanced GA >67wks)

 Uterus
 No abnormality visualized.
Impression

 Normal interval growth.
Recommendations

 Follow up as clinically indicated.
